# Patient Record
Sex: Male | Born: 1940 | Race: White | Hispanic: No | Marital: Married
Health system: Midwestern US, Community
[De-identification: ages and names within clinical notes are randomized; demographics above are authoritative.]

## PROBLEM LIST (undated history)

## (undated) DIAGNOSIS — R1310 Dysphagia, unspecified: Secondary | ICD-10-CM

## (undated) DIAGNOSIS — E1122 Type 2 diabetes mellitus with diabetic chronic kidney disease: Secondary | ICD-10-CM

## (undated) DIAGNOSIS — F03A Unspecified dementia, mild, without behavioral disturbance, psychotic disturbance, mood disturbance, and anxiety (HCC): Principal | ICD-10-CM

## (undated) DIAGNOSIS — G3183 Dementia with Lewy bodies: Secondary | ICD-10-CM

## (undated) DIAGNOSIS — N183 Chronic kidney disease, stage 3 unspecified (HCC): Secondary | ICD-10-CM

## (undated) DIAGNOSIS — J069 Acute upper respiratory infection, unspecified: Secondary | ICD-10-CM

## (undated) DIAGNOSIS — F02B Dementia in other diseases classified elsewhere, moderate, without behavioral disturbance, psychotic disturbance, mood disturbance, and anxiety (HCC): Secondary | ICD-10-CM

## (undated) DIAGNOSIS — IMO0001 Reserved for inherently not codable concepts without codable children: Secondary | ICD-10-CM

## (undated) DIAGNOSIS — R221 Localized swelling, mass and lump, neck: Secondary | ICD-10-CM

## (undated) DIAGNOSIS — C801 Malignant (primary) neoplasm, unspecified: Secondary | ICD-10-CM

## (undated) DIAGNOSIS — R059 Cough, unspecified: Secondary | ICD-10-CM

## (undated) DIAGNOSIS — C760 Malignant neoplasm of head, face and neck: Secondary | ICD-10-CM

## (undated) DIAGNOSIS — E119 Type 2 diabetes mellitus without complications: Secondary | ICD-10-CM

## (undated) MED ORDER — HYDROCODONE-ACETAMINOPHEN 5 MG-325 MG TAB
5-325 mg | ORAL_TABLET | Freq: Four times a day (QID) | ORAL | Status: AC | PRN
Start: ? — End: 2012-09-04

---

## 2007-10-08 NOTE — Op Note (Signed)
Op Notes signed by  at 10/08/07 [id number]                  Author: Jimmey RalphGelrud, Mirren Gest G  Service: --  Author Type: Physician            Filed:   Date of Service: 10/08/07 0030  Status: Signed          <!--EPICS--> Name:      Stanley CooleySNEAD, Karder H<BR> MR #:      [id number]                    Surgeon:        Rodman KeyLouis G. Shantana Christon, M.D.<BR> Account #: [id number]                  Surgery Date:<BR> DOB:       09-29-1940<BR> Age:       [age]                           Location:<BR> <BR>                              OPERATIVE REPORT<BR> <BR> <BR> <BR> PROCEDURE:  Colonoscopy.<BR> <BR> INDICATION:  The patient who has  had some hemorrhoidal bleeding, as well as<BR> diarrhea.  The diarrhea was ceased on the metformin, the dose was<BR> diminished.  The rectal bleeding has not recurred since the diarrhea has<BR> resolved.  This procedure is undertaken to screen the colon  further at this<BR> time.<BR> <BR> ANESTHESIA:<BR> 1.     Fentanyl 100 mcg intravenously.<BR> 2.     Versed 4 mg intravenously.<BR> <BR> ENDOSCOPE:  PCF-H180AL.<BR> <BR> OPERATOR:  Gwynn Chalker, MD.<BR> <BR> ESTIMATED BLOOD LOSS:<BR> <BR> SPECIMENS:<BR>  <BR> PROCEDURE IN DETAIL:  The patient was placed in left lateral decubitus<BR> position.  The endoscope advanced with the lumen always in view, to the<BR> cecum.  No abnormalities of the base of the cecum inclusive of the area<BR> behind the ileocecal  valve was seen. The ascending colon, hepatic flexure,<BR> transverse colons were without abnormalities.<BR> <BR> The splenic flexure had a single medium-sized diverticula and 2 other<BR> medium-sized diverticula were seen in the distal descending colon.   No<BR> other abnormalities were seen in the sigmoid colon nor rectum.<BR> Retroflexion of the colonoscope within the rectum revealed only some small<BR> hemorrhoids not actively bleeding at this time.<BR> <BR> The endoscope was then straightened and  removed, and the patient observed<BR> to tolerate the procedure well.  He was  returned to the recovery room in<BR> good condition.<BR> <BR> IMPRESSION:<BR> 1.     Diverticulosis coli, mild.<BR> 2.     Hemorrhoidal disease, mild.<BR> <BR> PLAN:<BR> 1.      Continue on his current medical regimen.<BR> 2.     He will return for follow up if diarrhea recurs.<BR> 3.     Elective colonoscopy in 10 years' time.<BR> <BR> <BR> <BR> <BR> <BR> <BR> E-Signed By<BR> Faithlyn Recktenwald G. Jamison NeighborGelrud, M.D. 10/08/2007 06:53<BR> Kataleyah Carducci  G. Ashrith Sagan, M.D.<BR> <BR> cc:   Cainan Trull G. Shakeda Pearse, M.D.<BR>       Shepard GeneralKelly E White, MD<BR> <BR> <BR> <BR> LGG/FI3; D: 10/07/2007  3:55 P; T: 10/08/2007 12:30 A; Job# [id number]<BR> <!--EPICE-->

## 2007-10-08 NOTE — Op Note (Signed)
Name: ROLLEN, SELDERS  MR #: [id number] Surgeon: Rodman Key. Jamison Neighbor, M.D.  Account #: [id number] Surgery Date:  DOB: Jul 10, 1941  Age: 67 Location:     OPERATIVE REPORT        PROCEDURE: Colonoscopy.    INDICATION: The patient who has had some hemorrhoidal bleeding, as well as  diarrhea. The diarrhea was ceased on the metformin, the dose was  diminished. The rectal bleeding has not recurred since the diarrhea has  resolved. This procedure is undertaken to screen the colon further at this  time.    ANESTHESIA:  1. Fentanyl 100 mcg intravenously.  2. Versed 4 mg intravenously.    ENDOSCOPE: PCF-H180AL.    OPERATOR: Brantley Fling, MD.    ESTIMATED BLOOD LOSS:    SPECIMENS:    PROCEDURE IN DETAIL: The patient was placed in left lateral decubitus  position. The endoscope advanced with the lumen always in view, to the  cecum. No abnormalities of the base of the cecum inclusive of the area  behind the ileocecal valve was seen. The ascending colon, hepatic flexure,  transverse colons were without abnormalities.    The splenic flexure had a single medium-sized diverticula and 2 other  medium-sized diverticula were seen in the distal descending colon. No  other abnormalities were seen in the sigmoid colon nor rectum.  Retroflexion of the colonoscope within the rectum revealed only some small  hemorrhoids not actively bleeding at this time.    The endoscope was then straightened and removed, and the patient observed  to tolerate the procedure well. He was returned to the recovery room in  good condition.    IMPRESSION:  1. Diverticulosis coli, mild.  2. Hemorrhoidal disease, mild.    PLAN:  1. Continue on his current medical regimen.  2. He will return for follow up if diarrhea recurs.  3. Elective colonoscopy in 10 years' time.              E-Signed By  Rodman Key Jamison Neighbor, M.D. 10/08/2007 06:53  Terianne Thaker G. Amrita Radu, M.D.    cc: Rodman Key. Jamison Neighbor, M.D.   Shepard General, MD         LGG/FI3; D: 10/07/2007 3:55 P; T: 10/08/2007 12:30 A; Job# [id number]

## 2008-03-18 LAB — METABOLIC PANEL, COMPREHENSIVE
A-G Ratio: 1.3 (ref 1.1–2.2)
ALT (SGPT): 45 U/L (ref 30–65)
AST (SGOT): 21 U/L (ref 15–37)
Albumin: 4.2 g/dL (ref 3.5–5.0)
Alk. phosphatase: 55 U/L (ref 50–136)
Anion gap: 8 mmol/L (ref 5–15)
BUN/Creatinine ratio: 16 (ref 12–20)
BUN: 18 MG/DL (ref 6–20)
Bilirubin, total: 0.5 MG/DL (ref <1.0)
CO2: 29 MMOL/L (ref 21–32)
Calcium: 9.2 MG/DL (ref 8.5–10.1)
Chloride: 104 MMOL/L (ref 97–108)
Creatinine: 1.1 MG/DL (ref 0.6–1.3)
GFR est AA: >60 mL/min/{1.73_m2} (ref >60)
GFR est non-AA: >60 mL/min/{1.73_m2} (ref >60)
Globulin: 3.3 g/dL (ref 2.0–4.0)
Glucose: 151 MG/DL — ABNORMAL HIGH (ref 50–100)
Potassium: 4.5 MMOL/L (ref 3.5–5.1)
Protein, total: 7.5 g/dL (ref 6.4–8.2)
Sodium: 141 MMOL/L (ref 136–145)

## 2008-03-18 LAB — LIPID PANEL
CHOL/HDL Ratio: 4.3 (ref 0–5.0)
Cholesterol, total: 142 MG/DL (ref <200)
HDL Cholesterol: 33 MG/DL — ABNORMAL LOW (ref 40–60)
LDL, calculated: 79.4 MG/DL (ref 0–100)
Triglyceride: 148 MG/DL (ref 30–200)
VLDL, calculated: 29.6 MG/DL

## 2008-03-19 LAB — HEMOGLOBIN A1C WITH EAG: Hemoglobin A1c: 7 % — ABNORMAL HIGH (ref 4.2–5.8)

## 2008-06-23 ENCOUNTER — Ambulatory Visit

## 2008-06-23 LAB — METABOLIC PANEL, BASIC
Anion gap: 10 mmol/L (ref 5–15)
BUN/Creatinine ratio: 22 — ABNORMAL HIGH (ref 12–20)
BUN: 22 MG/DL — ABNORMAL HIGH (ref 6–20)
CO2: 27 MMOL/L (ref 21–32)
Calcium: 9.4 MG/DL (ref 8.5–10.1)
Chloride: 102 MMOL/L (ref 97–108)
Creatinine: 1 MG/DL (ref 0.6–1.3)
GFR est AA: >60 mL/min/{1.73_m2} (ref >60)
GFR est non-AA: >60 mL/min/{1.73_m2} (ref >60)
Glucose: 158 MG/DL — ABNORMAL HIGH (ref 50–100)
Potassium: 4.5 MMOL/L (ref 3.5–5.1)
Sodium: 139 MMOL/L (ref 136–145)

## 2008-06-23 MED ORDER — ZOSTAVAX (PF) 19,400 UNIT/0.65 ML SUBCUTANEOUS SUSPENSION
19400 unit/0.65 mL | SUBCUTANEOUS | Status: DC
Start: 2008-06-23 — End: 2008-09-22

## 2008-06-23 NOTE — Patient Instructions (Signed)
English   Spanish   Back Pain: After Your Visit  Your Care Instructions  Back pain has many possible causes. It is often related to problems with muscles and ligaments of the back. It may also be related to problems with the nerves, discs, or bones of the back. Moving, lifting, standing, sitting, or sleeping in an awkward way can strain the back. Sometimes you do not notice the injury until later. Arthritis is another common cause of back pain.  Although it may hurt a lot, back pain usually improves on its own within several weeks. Most people recover in 12 weeks or less. Using good home treatment and being careful not to stress your back can help you feel better sooner.  Follow-up care is a key part of your treatment and safety. Be sure to make and go to all appointments, and call your doctor if you are having problems. It???s also a good idea to know your test results and keep a list of the medicines you take.  How can you care for yourself at home?  ?? Sit or lie in positions that are most comfortable and reduce your pain. Try one of these positions when you lie down:   Lie on your back with your knees bent and supported by large pillows.   Lie on the floor with your legs on the seat of a sofa or chair.   Lie on your side with your knees and hips bent and a pillow between your legs.   Lie on your stomach if it does not make pain worse.  Do not sit up in bed, and avoid soft couches and twisted positions. Bed rest can help relieve pain at first, but it delays healing. Avoid bed rest after the first day.   Change positions every 30 minutes. If you must sit for long periods of time, take breaks from sitting. Get up and walk around, or lie in a comfortable position.   For the first 2 or 3 days, put ice or cold packs on your back for 10 to 20 minutes at a time, several times a day. (Put a thin cloth between the ice pack and your skin.) This reduces pain.    After the first 2 or 3 days, use a warm pack or heating pad for 20 minutes at a time. Hot showers will also help. Hot baths may help as long as you can lie or sit in a position that does not stress your back. You may also keep using ice if it helps.   Take pain medicines exactly as directed.   If the doctor gave you a prescription medicine for pain, take it as prescribed.   If you are not taking a prescription pain medicine, ask your doctor if you can take an over-the-counter medicine.   Do not take two or more pain medicines at the same time unless the doctor told you to. Many pain medicines have acetaminophen, which is Tylenol. Too much acetaminophen (Tylenol) can be harmful.  Take short walks several times a day. You can start with 5 to 10 minutes, 3 to 4 times a day, and work up to longer walks. Stick to level surfaces and avoid hills and stairs until your back is better.   Return to work and other activities as soon as you can. Continued rest without activity is usually not good for your back.   To prevent future back pain, do exercises to stretch and strengthen your back and stomach. Learn how   to use good posture, safe lifting techniques, and proper body mechanics.  When should you call for help?  Call 911 anytime you think you may need emergency care. For example, call if:  ?? You lose bladder or bowel control.   You suddenly cannot walk or stand.   You have sudden numbness or weakness in both legs.  Call your doctor now or seek immediate medical care if:  ?? You have new pain, numbness, tingling, or weakness, especially in the buttocks, genital or rectal area, legs, or feet.   You have symptoms of a urinary infection. For example:   You have blood or pus in your urine.   You have pain in your back just below your rib cage. This is called flank pain.   You have a fever, chills, or body aches.   It hurts to urinate. You have groin or belly pain.   Watch closely for changes in your health, and be sure to contact your doctor if:  ?? Your back pain gets worse or more frequent.   Your back pain is not better after 1 week of home treatment. It may take a lot longer for the pain to go away completely, but it should feel at least a little better.   Where can you learn more?   Go to http://www.healthwise.net/[facility]  Enter I594 in the search box to learn more about "Back Pain: After Your Visit".    ?? 2006 - 2009 Healthwise[employer] Inc[employer]. Care instructions adapted under license by [facility] (which disclaims liability or warranty for this information) . This care instruction is for use with your licensed healthcare professional. If you have questions about a medical condition or this instruction, always ask your healthcare professional. Healthwise disclaims any warranty or liability for your use of this information.

## 2008-06-23 NOTE — Progress Notes (Signed)
HISTORY OF PRESENT ILLNESS  Stanley Holt is a 67 y.o. male.  HPI  The patient presents today in follow-up for his medical problems of adult onset diabetes mellitus, hyperlipidemia and hypertension.  He states he has been doing well on his current medications.  Blood sugars he states have been good, although he does not bring in his numbers today and cannot remember what they are.  He states he does follow a diabetic diet and has been working on that.  His last hemoglobin A1c was 7.0.  He is getting yearly eye checks.      Today, he also complains of right mid to upper back discomfort that started over the past week.  He does not note any particular activity or injury to his back.  The pain does not radiate down his legs.  There is no associated numbness or tingling or weakness.      He denies any other problems today.         Current outpatient prescriptions   Medication Sig Dispense Refill   ??? zoster vaccine live, pf, (ZOSTAVAX) 19,400 unit SolR 19,400 Units by SubCUTAneous route as directed. x1  1 Dose  0   ??? metformin (GLUCOPHAGE) 500 mg tablet Take 1 Tab by mouth two (2) times daily (with meals).       ??? glimepiride (AMARYL) 4 mg tablet Take 4 mg by mouth every morning.       ??? lisinopril (PRINIVIL, ZESTRIL) 10 mg tablet Take  by mouth daily.       ??? aspirin 81 mg Tab Take  by mouth.       ??? omega-3 fatty acids-vitamin e (FISH OIL) 1,000 mg Cap Take  by mouth.       ??? VITAMIN B COMP W-C (SUPER B COMPLEX + C PO) Take  by mouth.         No Known Allergies  History   Substance Use Topics   ??? Tobacco Use: Quit -- 40 years     Quit date: 09/04/2002   ??? Alcohol Use:         ROS  as per HPI       Physical Exam    OBJECTIVE: BP 112/72   Pulse 85   Resp 16   Wt 242 lb 3.2 oz (109.861 kg)   General appearance: alert, well appearing, and in no distress.  Chest: clear to auscultation, no wheezes, rales or rhonchi, symmetric air entry.   CVS exam: normal rate and regular rhythm, S1 and S2 normal.   Exam of extremities: pedal edema tr +, intact peripheral pulses  Back exam: pain with motion noted during exam, tenderness noted right parathoracic muscle tenderness and spasm, negative straight leg lift.     Neuro nonfocal  ASSESSMENT and PLAN  Adult onset diabetes mellitus.  Check hemoglobin A1c, counseled again regarding importance of strict diabetic diet.  Would like to see his hemoglobin A1c closer to 6.5.  The patient is aware of this.  He does not want to add any new medications but instead work on lifestyle changes over the next several months, even if his hemoglobin A1c is elevated above goal.  He is getting yearly eye checks.  Encouraged foot care.   Hyperlipidemia.  Fasting lipid panel, LFT's.  Continued with low-fat, low-cholesterol diet.    Hypertension, stable on current medication.   Thoracic strain.  Recommend Tylenol or Advil prn.  Follow-up if any acute worsening or no improvement.

## 2008-06-24 LAB — HEMOGLOBIN A1C WITH EAG: Hemoglobin A1c: 6.9 % — ABNORMAL HIGH (ref 4.2–5.8)

## 2008-07-27 MED ORDER — LISINOPRIL 10 MG TAB
10 mg | ORAL_TABLET | Freq: Every day | ORAL | Status: DC
Start: 2008-07-27 — End: 2008-09-22

## 2008-09-22 ENCOUNTER — Ambulatory Visit

## 2008-09-22 MED ORDER — GLIMEPIRIDE 4 MG TAB
4 mg | ORAL_TABLET | Freq: Two times a day (BID) | ORAL | Status: DC
Start: 2008-09-22 — End: 2009-04-02

## 2008-09-22 MED ORDER — LISINOPRIL 10 MG TAB
10 mg | ORAL_TABLET | Freq: Every day | ORAL | Status: DC
Start: 2008-09-22 — End: 2009-04-02

## 2008-09-22 MED ORDER — METFORMIN SR 500 MG 24 HR TABLET
500 mg | ORAL_TABLET | Freq: Every day | ORAL | Status: DC
Start: 2008-09-22 — End: 2009-03-31

## 2008-09-22 NOTE — Progress Notes (Signed)
Here for a 3 month f/u also C/o elevated bs for a short time but has now improved

## 2008-09-23 LAB — METABOLIC PANEL, BASIC
Anion gap: 13 mmol/L (ref 5–15)
BUN/Creatinine ratio: 18 (ref 12–20)
BUN: 18 MG/DL (ref 6–20)
CO2: 27 MMOL/L (ref 21–32)
Calcium: 8.8 MG/DL (ref 8.5–10.1)
Chloride: 102 MMOL/L (ref 97–108)
Creatinine: 1 MG/DL (ref 0.6–1.3)
GFR est AA: >60 mL/min/{1.73_m2} (ref >60)
GFR est non-AA: >60 mL/min/{1.73_m2} (ref >60)
Glucose: 79 MG/DL (ref 50–100)
Potassium: 3.9 MMOL/L (ref 3.5–5.1)
Sodium: 142 MMOL/L (ref 136–145)

## 2008-09-23 LAB — HEMOGLOBIN A1C WITH EAG: Hemoglobin A1c: 7.6 % — ABNORMAL HIGH (ref 4.2–5.8)

## 2008-09-23 NOTE — Progress Notes (Signed)
HISTORY OF PRESENT ILLNESS  Stanley Holt is a 68 y.o. male.  HPIThe patient presents today in follow-up for his medical problems of adult onset diabetes mellitus, hyperlipidemia.  He states he has noted some increase blood sugars over past few weeks associated with recent URI.  Blood sugars in the 160's, so he increased his metformin er to 3 tablets a day.  He feels like his blood sugars have started to improve and he denies any side effects on the increased dose.  His uri symptoms have resolved.  He states he does follow a diabetic diet and has been working on that.  His last hemoglobin A1c was 6.9.  He is getting yearly eye checks.    He denies any other problems today.      ROSas per HPI   Physical Exam  BP 112/84   Pulse 84   Wt 242 lb (109.77 kg)  General appearance: alert, well appearing, and in no distress.  Chest: clear to auscultation, no wheezes, rales or rhonchi, symmetric air entry.   CVS exam: normal rate and regular rhythm,  Exam of extremities: peripheral pulses normal, no pedal edema, no clubbing or cyanosis     ASSESSMENT and PLAN  Christop was seen today for follow-up.    Diagnoses and associated orders for this visit:    Dm w/o complication type ii, uncontrolled  -     Tolerating the increase on metformin er, will recheck hgba1c in 3 months to reassess  - metformin-ER (GLUCOPHAGE XR) tablet; Take 3 Tabs by mouth daily (with dinner).  - glimepiride (AMARYL) tablet; Take 1 Tab by mouth two (2) times a day.  - HEMOGLOBIN A1C; Future  - METABOLIC PANEL, BASIC; Future    Other and unspecified hyperlipidemia stable    Microalbuminuria stable  - lisinopril (PRINIVIL, ZESTRIL) tablet; Take 1 Tab by mouth daily.    Follow-up Disposition:  Return in about 4 months (around 01/20/2009).  lab results and schedule of future lab studies reviewed with patient  reviewed diet, exercise and weight control  cardiovascular risk and specific lipid/LDL goals reviewed   specific diabetic recommendations: diabetic diet discussed in detail, written exchange diet given, home glucose monitoring emphasized, foot care discussed and Podiatry visits discussed, annual eye examinations at Ophthalmology discussed, glycohemoglobin and other lab monitoring discussed and labs immediately prior to next visit  use of aspirin to prevent MI and TIA's discussed

## 2009-02-03 ENCOUNTER — Ambulatory Visit

## 2009-02-03 LAB — LIPID PANEL
CHOL/HDL Ratio: 4.4 (ref 0–5.0)
Cholesterol, total: 150 MG/DL (ref <200)
HDL Cholesterol: 34 MG/DL
LDL, calculated: 83.6 MG/DL (ref 0–100)
Triglyceride: 162 MG/DL — ABNORMAL HIGH (ref <150)
VLDL, calculated: 32.4 MG/DL

## 2009-02-03 LAB — METABOLIC PANEL, BASIC
Anion gap: 10 mmol/L (ref 5–15)
BUN/Creatinine ratio: 24 — ABNORMAL HIGH (ref 12–20)
BUN: 24 MG/DL — ABNORMAL HIGH (ref 6–20)
CO2: 25 MMOL/L (ref 21–32)
Calcium: 9.3 MG/DL (ref 8.5–10.1)
Chloride: 101 MMOL/L (ref 97–108)
Creatinine: 1 mg/dL (ref 0.6–1.3)
GFR est AA: >60 mL/min/{1.73_m2} (ref >60)
GFR est non-AA: >60 mL/min/{1.73_m2} (ref >60)
Glucose: 117 MG/DL — ABNORMAL HIGH (ref 65–100)
Potassium: 4.7 MMOL/L (ref 3.5–5.1)
Sodium: 136 MMOL/L (ref 136–145)

## 2009-02-03 LAB — PSA SCREENING (SCREENING): Prostate Specific Ag: 3.8 ng/mL (ref 0.0–4.0)

## 2009-02-03 LAB — MICROALBUMIN, UR, RAND W/ MICROALB/CREAT RATIO
Creatinine, urine random: 83.5 mg/dL
Microalbumin,urine random: 3.89 MG/DL
Microalbumin/Creat ratio (mg/g creat): 47 mg/g — ABNORMAL HIGH (ref 0–30)

## 2009-02-03 NOTE — Progress Notes (Signed)
DM follow up

## 2009-02-03 NOTE — Progress Notes (Signed)
HISTORY OF PRESENT ILLNESS  Stanley Holt is a 68 y.o. male.  HPI He presents today in follow up AODM, microalbuminuria, osteoarthritis.   Diabetes Mellitus:he does have mild diarrhea on metformin.  He has diabetes mellitus.  Diabetic ROS - medication compliance: compliant all of the time, diabetic diet compliance: compliant most of the time, home glucose monitoring: is performed regularly, fasting values range less than 140, last eye exam approximately 2years ago.     Osteoarthritis and Chronic Pain:  Patient has osteoarthritis, primarily affecting the back and knees.   Symptoms onset: problem is longstanding.  Rheumatological ROS: stable, mild-to-moderate joint symptoms intermittently, reasonably well controlled by PRN meds.   Response to treatment plan: stable.   Current outpatient prescriptions   Medication Sig Dispense Refill   ??? metformin ER (GLUCOPHAGE XR) 500 mg tablet Take 3 Tabs by mouth daily (with dinner).  270 Tab  1   ??? lisinopril (PRINIVIL, ZESTRIL) 10 mg tablet Take 1 Tab by mouth daily.  90 Tab  1   ??? glimepiride (AMARYL) 4 mg tablet Take 1 Tab by mouth two (2) times a day.  180 Tab  1   ??? aspirin 81 mg Tab Take  by mouth.       ??? omega-3 fatty acids-vitamin e (FISH OIL) 1,000 mg Cap Take  by mouth.       ??? VITAMIN B COMP W-C (SUPER B COMPLEX + C PO) Take  by mouth.           No Known Allergies  History   Substance Use Topics   ??? Tobacco Use: Quit -- 40 years     Quit date: 09/04/2002   ??? Alcohol Use:         ROS  as per HPI     Physical Exam  BP 104/76   Pulse 80   Wt 236 lb 12.8 oz (107.412 kg)  General appearance: alert, well appearing, and in no distress.  Chest: clear to auscultation, no wheezes, rales or rhonchi, symmetric air entry.   CVS exam: normal rate and regular rhythm, S1 and S2 normal.  Exam of extremities: peripheral pulses normal, no pedal edema, no clubbing or cyanosis     Assessment/ Plan:    1. 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  He would be agreeable to trying additional metformin if hgba1c not at goal.  He does note some mild diarrhea with his metformin   2. Microalbuminuria (791.0Y)  Recheck urine   3. Screening PSA (prostate specific antigen) (V76.44C)  PSA SCREENING (MEDICARE)   4. Osteoarthritis  Continue with tylenol prn.  I have discussed the diagnosis with the patient and the intended plan as seen in the above orders.  The patient has received an after-visit summary.  I have discussed medication side effects and warnings with the patient as well.    Follow-up Disposition:  Return in about 4 months (around 06/05/2009).

## 2009-02-04 LAB — HEMOGLOBIN A1C WITH EAG: Hemoglobin A1c: 6.6 % — ABNORMAL HIGH (ref 4.2–5.8)

## 2009-02-24 NOTE — Telephone Encounter (Signed)
LMTCB

## 2009-02-26 NOTE — Telephone Encounter (Signed)
Message copied by Raleigh Lions on Fri Feb 26, 2009  1:44 PM  ------       Message from: [place], Nicholaus Bloom, E       Created: Tue Feb 23, 2009  5:52 PM         PSA 3.8 notify patient that this is upper limits of normal and would like to repeat PSA in 6 months.       If he is having any changes in urinary stream or any hesitancy then will refer to urology.       ----- Message -----          From: Mickel Crow, MD          Sent: 02/11/2009   8:21 AM            To: Mickel Crow, MD              psa compare 3.8

## 2009-02-26 NOTE — Telephone Encounter (Signed)
called on preferred # [phone] no answer no machine

## 2009-03-02 NOTE — Telephone Encounter (Signed)
Message copied by Raleigh Lions on Tue Mar 02, 2009  4:58 PM  ------       Message from: Kechi, Nicholaus Bloom, E       Created: Tue Feb 23, 2009  5:52 PM         PSA 3.8 notify patient that this is upper limits of normal and would like to repeat PSA in 6 months.       If he is having any changes in urinary stream or any hesitancy then will refer to urology.       ----- Message -----          From: Mickel Crow, MD          Sent: 02/11/2009   8:21 AM            To: Mickel Crow, MD              psa compare 3.8

## 2009-03-02 NOTE — Telephone Encounter (Signed)
Notified patient of lab PSA.  Denies any difficulty urinating but will let us know if he develops any problems.

## 2009-03-15 ENCOUNTER — Ambulatory Visit

## 2009-03-15 LAB — CBC WITH AUTOMATED DIFF
ABS. BASOPHILS: 0 10*3/uL (ref 0.0–0.1)
ABS. EOSINOPHILS: 0.2 10*3/uL (ref 0.0–0.4)
ABS. LYMPHOCYTES: 2 10*3/uL (ref 0.8–3.5)
ABS. MONOCYTES: 0.6 10*3/uL (ref 0.0–1.0)
ABS. NEUTROPHILS: 3.3 10*3/uL (ref 1.8–8.0)
BASOPHILS: 0 % (ref 0–1)
EOSINOPHILS: 3 % (ref 0–7)
HCT: 40.4 % (ref 36.6–50.3)
HGB: 13.9 g/dL (ref 12.1–17.0)
LYMPHOCYTES: 32 % (ref 12–49)
MCH: 31.1 PG (ref 26.0–34.0)
MCHC: 34.4 g/dL (ref 30.0–36.5)
MCV: 90.4 FL — ABNORMAL HIGH (ref 80.0–90.0)
MONOCYTES: 11 % (ref 5–13)
NEUTROPHILS: 54 % (ref 32–75)
PLATELET: 175 10*3/uL (ref 150–400)
RBC: 4.47 M/uL (ref 4.10–5.70)
RDW: 13 % (ref 11.5–14.5)
WBC: 6.1 10*3/uL (ref 4.1–11.1)

## 2009-03-15 NOTE — Progress Notes (Signed)
Lump at right side of throat - came up overnight - noticed last Tuesday morning.  No c/o pain, no c/o soreness.  Doesn't interfere with swallowing.  Pt states it has not grown or shrunk in size.

## 2009-03-15 NOTE — Progress Notes (Signed)
HISTORY OF PRESENT ILLNESS  Alba Kriesel is a 68 y.o. male.  HPI Comments: Noticed it last week 7/5 or 7/6 a swelling on right side of neck, no soreness or problem swallowing, no fever or chills, no bites noticed anywhere, no ear pain but some itching.  No ST.       Review of Systems   Constitutional: Negative for fever.   HENT: Negative for hearing loss, congestion and sore throat.    Respiratory: Negative for cough.    Musculoskeletal: Negative for myalgias.   Skin: Negative for rash.   Neurological: Negative for headaches.     BP 106/76   Pulse 80   Temp(Src) 97.6 ??F (36.4 ??C) (Oral)   Wt 236 lb 9.6 oz (107.321 kg)    Physical Exam   Constitutional: He is oriented to person, place, and time. He appears well-developed and well-nourished.   HENT:   Head: Normocephalic and atraumatic.   Right Ear: Tympanic membrane, external ear and ear canal normal.   Left Ear: Tympanic membrane, external ear and ear canal normal.   Nose: Mucosal edema present. Right sinus exhibits no maxillary sinus tenderness and no frontal sinus tenderness. Left sinus exhibits no maxillary sinus tenderness and no frontal sinus tenderness.   Mouth/Throat: Uvula is midline and mucous membranes are normal. Posterior oropharyngeal erythema present.        No oral lesions    Neck: Carotid bruit is not present.        Has a movable nontender nodule on right side neck in ant. Cervical chain measures 3 cm x 2.5 cm with another anterior to that about 1 cm diameter.    Cardiovascular: Normal rate, regular rhythm and normal heart sounds.    Pulmonary/Chest: Effort normal and breath sounds normal.   Neurological: He is alert and oriented to person, place, and time.       ASSESSMENT and PLAN  1. Lymphadenopathy - swelling (785.6BJ)  CBC WITH AUTOMATED DIFF       Will use saline rinses tid and moist heat to external area and RTC 2 weeks for recheck (pt going OOT for one week) . Will notify us if changes.

## 2009-03-16 NOTE — Telephone Encounter (Signed)
Told CBC WNL and keep fuv in 2 wks when returns from OOT>

## 2009-03-31 ENCOUNTER — Encounter

## 2009-03-31 NOTE — Telephone Encounter (Signed)
Patient has appt with you Friday, but will be out of this med Thursday.

## 2009-04-01 MED ORDER — METFORMIN SR 500 MG 24 HR TABLET
500 mg | ORAL_TABLET | Freq: Every day | ORAL | Status: DC
Start: 2009-04-01 — End: 2010-05-25

## 2009-04-02 MED ORDER — BLOOD SUGAR DIAGNOSTIC TEST STRIPS
PACK | Status: DC
Start: 2009-04-02 — End: 2010-05-30

## 2009-04-02 MED ORDER — LISINOPRIL 10 MG TAB
10 mg | ORAL_TABLET | Freq: Every day | ORAL | Status: DC
Start: 2009-04-02 — End: 2009-12-02

## 2009-04-02 MED ORDER — GLIMEPIRIDE 4 MG TAB
4 mg | ORAL_TABLET | Freq: Two times a day (BID) | ORAL | Status: DC
Start: 2009-04-02 — End: 2010-05-25

## 2009-04-02 NOTE — Progress Notes (Signed)
Patient in for lump in right neck area

## 2009-04-02 NOTE — Progress Notes (Signed)
[facility] Dr[facility] Karrie Doffing Appt Aug 3rd 9:50 AM

## 2009-04-04 NOTE — Progress Notes (Signed)
HISTORY OF PRESENT ILLNESS  Stanley Holt is a 68 y.o. male.  HPI He presents today in follow up for neck mass noted 1 month ago. Patient does not feel there has been any change in size.  He denies recent URI symptoms. He does have a distant history of tobacco use.  No difficulty swallowing.  He also recently had PSA upper limits of normal with no comparison available.  He denies any problems with urinary hesitancy or decreased urinary stream.  He request refill of diabetes medications today.    Current outpatient prescriptions   Medication Sig Dispense Refill   ??? glucose blood VI test strips (ASCENSIA AUTODISC VI, ONE TOUCH ULTRA TEST VI) strip 1 Each by Does Not Apply route. As directed  1 Package  11   ??? glimepiride (AMARYL) 4 mg tablet Take 1 Tab by mouth two (2) times a day.  180 Tab  1   ??? lisinopril (PRINIVIL, ZESTRIL) 10 mg tablet Take 1 Tab by mouth daily.  90 Tab  1   ??? metformin ER (GLUCOPHAGE XR) 500 mg tablet Take 3 Tabs by mouth daily (with dinner).  270 Tab  1   ??? ERGOCALCIFEROL (VITAMIN D PO) Take  by mouth.       ??? aspirin 81 mg Tab Take  by mouth.       ??? omega-3 fatty acids-vitamin e (FISH OIL) 1,000 mg Cap Take  by mouth.       ??? VITAMIN B COMP W-C (SUPER B COMPLEX + C PO) Take  by mouth.           No Known Allergies  History   Substance Use Topics   ??? Tobacco Use: Quit -- 40 years     Quit date: 09/04/2002   ??? Alcohol Use:         ROS  as per HPI     Physical Exam  BP 104/70   Pulse 68   Wt 237 lb 12.8 oz (107.865 kg)  General appearance: alert, well appearing, and in no distress.  Head; normocephalic, atraumatic. PERLA. ENT- right anterior cervical 3 x 2 cm mass, nontender, firm, no bruit..   Chest: clear to auscultation, no wheezes, rales or rhonchi, symmetric air entry.   CVS exam: normal rate and regular rhythm, S1 and S2 normal.  Exam of extremities: peripheral pulses normal, no pedal edema, no clubbing or cyanosis     Assessment/ Plan:    1. Neck mass (784.16F) uncertain etiology and will refer to ENT for further evaluation and biopsy of area if deemed necessary REFERRAL TO ENT   2. Elevated PSA (790.93A) upper limits of normal and will repeat in 6 months to assure stability, PROSTATE SPECIFIC AG (PSA)   3. 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  The patient has received an after-visit summary along with patient information handout.  I have discussed medication side effects and warnings with the patient as well.    Follow-up Disposition: 3 months

## 2009-04-29 NOTE — Progress Notes (Signed)
Patient in for discussion of lab results

## 2009-04-29 NOTE — Progress Notes (Signed)
HISTORY OF PRESENT ILLNESS  Stanley Holt is a 68 y.o. male.  HPI He presents today in follow up for newly diagnosed squamous cell ca of neck.  He is accompanied by his wife to discuss his therapeutic options.  He has an appt with Dr Crista Curb in am.  Cardiovascular Review:  Diet and Lifestyle: generally follows a low fat low cholesterol diet, generally follows a low sodium diet, nonsmoker.  Pertinent ROS: taking medications as instructed, no medication side effects noted, no TIA's, no chest pain on exertion, no dyspnea on exertion, no swelling of ankles.   Diabetes Mellitus:  He has diabetes mellitus.  Diabetic ROS - medication compliance: compliant all of the time, diabetic diet compliance: compliant most of the time, home glucose monitoring: is performed regularly, fasting values range less that 120 for the most part.   Lab review: labs are reviewed, up to date and normal.   Current outpatient prescriptions   Medication Sig Dispense Refill   ??? glimepiride (AMARYL) 4 mg tablet Take 1 Tab by mouth two (2) times a day.  180 Tab  1   ??? lisinopril (PRINIVIL, ZESTRIL) 10 mg tablet Take 1 Tab by mouth daily.  90 Tab  1   ??? glucose blood VI test strips (ASCENSIA AUTODISC VI, ONE TOUCH ULTRA TEST VI) strip 1 Each by Does Not Apply route. As directed  1 Package  11   ??? metformin ER (GLUCOPHAGE XR) 500 mg tablet Take 3 Tabs by mouth daily (with dinner).  270 Tab  1   ??? ERGOCALCIFEROL (VITAMIN D PO) Take  by mouth.       ??? aspirin 81 mg Tab Take  by mouth.       ??? omega-3 fatty acids-vitamin e (FISH OIL) 1,000 mg Cap Take  by mouth.       ??? VITAMIN B COMP W-C (SUPER B COMPLEX + C PO) Take  by mouth.           No Known Allergies  History   Substance Use Topics   ??? Tobacco Use: Quit -- 40 years     Quit date: 09/04/2002   ??? Alcohol Use:         ROS  as per HPI     Physical Exam  BP 100/72   Pulse 72   Wt 242 lb (109.77 kg)  General appearance: alert, well appearing, and in no distress.   Head; normocephalic, atraumatic. PERLA. ENT- throat normal without erythema or exudate and right neck mass unchanged.   Chest: clear to auscultation, no wheezes, rales or rhonchi, symmetric air entry.   CVS exam: normal rate and regular rhythm, S1 and S2 normal.  Exam of extremities: peripheral pulses normal, no pedal edema, no clubbing or cyanosis     Assessment/ Plan:   1. Cancer of neck (195.0AF) long discussion with patient regarding surgery, radiation, chemotherapy.  He has a lot of concerns regarding his overall health and ability to have this treatment.  I have reassured him that he is in good health for this treatment.  The decision on surgery vs chemotherapy along with radiation will need to be discussed further with Dr Crista Curb in oncology and he will be seeing him in the morning.   2. 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     I  have discussed the diagnosis with the patient and the intended plan as seen in the above orders.  The patient has received an after-visit summary.  I have discussed medication side effects and warnings with the patient as well.    Follow-up Disposition:  Return if symptoms worsen or fail to improve.

## 2009-04-30 ENCOUNTER — Encounter

## 2009-05-04 LAB — METABOLIC PANEL, BASIC
Anion gap: 7 mmol/L (ref 5–15)
BUN/Creatinine ratio: 24 — ABNORMAL HIGH (ref 12–20)
BUN: 26 MG/DL — ABNORMAL HIGH (ref 6–20)
CO2: 29 MMOL/L (ref 21–32)
Calcium: 9.3 MG/DL (ref 8.5–10.1)
Chloride: 106 MMOL/L (ref 97–108)
Creatinine: 1.1 MG/DL (ref 0.6–1.3)
GFR est AA: >60 mL/min/{1.73_m2} (ref >60)
GFR est non-AA: >60 mL/min/{1.73_m2} (ref >60)
Glucose: 124 MG/DL — ABNORMAL HIGH (ref 65–100)
Potassium: 4.6 MMOL/L (ref 3.5–5.1)
Sodium: 142 MMOL/L (ref 136–145)

## 2009-05-04 LAB — CBC WITH AUTOMATED DIFF
ABS. BASOPHILS: 0 10*3/uL (ref 0.0–0.1)
ABS. EOSINOPHILS: 0.2 10*3/uL (ref 0.0–0.4)
ABS. LYMPHOCYTES: 1.6 10*3/uL (ref 0.8–3.5)
ABS. MONOCYTES: 0.7 10*3/uL (ref 0.0–1.0)
ABS. NEUTROPHILS: 3.1 10*3/uL (ref 1.8–8.0)
BASOPHILS: 0 % (ref 0–1)
EOSINOPHILS: 3 % (ref 0–7)
HCT: 38.9 % (ref 36.6–50.3)
HGB: 13.1 g/dL (ref 12.1–17.0)
LYMPHOCYTES: 28 % (ref 12–49)
MCH: 31 PG (ref 26.0–34.0)
MCHC: 33.7 g/dL (ref 30.0–36.5)
MCV: 92.2 FL — ABNORMAL HIGH (ref 80.0–90.0)
MONOCYTES: 12 % (ref 5–13)
NEUTROPHILS: 57 % (ref 32–75)
PLATELET: 159 10*3/uL (ref 150–400)
RBC: 4.22 M/uL (ref 4.10–5.70)
RDW: 13.1 % (ref 11.5–14.5)
WBC: 5.6 10*3/uL (ref 4.1–11.1)

## 2009-05-05 LAB — GLUCOSE, POC: Glucose (POC): 85 mg/dL (ref 75–110)

## 2009-05-06 LAB — PROTHROMBIN TIME + INR
INR: 1 (ref 0.9–1.1)
Prothrombin time: 10.5 SECS (ref 9.0–11.0)

## 2009-05-06 LAB — PTT: aPTT: 27.2 s (ref 24.0–33.0)

## 2009-05-06 MED ORDER — HEPARIN LOCK FLUSH 100 UNIT/ML IV SOLN
100 unit/mL | INTRAVENOUS | Status: DC | PRN
Start: 2009-05-06 — End: 2009-05-06

## 2009-05-06 MED ADMIN — midazolam (VERSED) injection 0.5-1 mg: INTRAVENOUS | @ 15:00:00 | NDC 63323041112

## 2009-05-06 MED ADMIN — midazolam (VERSED) injection 0.5-1 mg: INTRAVENOUS | @ 14:00:00 | NDC 63323041112

## 2009-05-06 MED ADMIN — fentanyl citrate (pf) injection 12.5-50 mcg: INTRAVENOUS | @ 14:00:00 | NDC 68258301001

## 2009-05-06 MED ADMIN — sodium bicarbonate (NEUT) injection 40 mg: SUBCUTANEOUS | @ 15:00:00 | NDC 00409660902

## 2009-05-06 MED ADMIN — lidocaine (PF) (XYLOCAINE) 10 mg/mL (1 %) injection Soln 10 mL: SUBCUTANEOUS | @ 15:00:00 | NDC 00409427902

## 2009-05-06 MED ADMIN — fentanyl citrate (pf) injection 12.5-50 mcg: INTRAVENOUS | @ 15:00:00 | NDC 68258301001

## 2009-05-06 MED ADMIN — cefazolin (ANCEF) 1 g in 0.9% sodium chloride (MBP/ADV) 50 mL MBP: INTRAVENOUS | @ 14:00:00 | NDC 60505074905

## 2009-05-06 MED ADMIN — heparin (porcine) 100 unit/mL injection 300 Units: @ 15:00:00 | NDC 00641243645

## 2009-05-06 MED ADMIN — lidocaine-epinephrine (XYLOCAINE) 1 %-1:100,000 injection 15 mg: INTRADERMAL | @ 15:00:00 | NDC 00409317801

## 2009-05-06 MED FILL — NEUT 4 % INTRAVENOUS SOLUTION: 4 % | INTRAVENOUS | Qty: 1

## 2009-05-06 MED FILL — MIDAZOLAM 1 MG/ML IJ SOLN: 1 mg/mL | INTRAMUSCULAR | Qty: 6

## 2009-05-06 MED FILL — SODIUM CHLORIDE 0.9 % IV: INTRAVENOUS | Qty: 1000

## 2009-05-06 MED FILL — CEFAZOLIN 1 GRAM SOLUTION FOR INJECTION: 1 gram | INTRAMUSCULAR | Qty: 1000

## 2009-05-06 MED FILL — LIDOCAINE (PF) 10 MG/ML (1 %) IJ SOLN: 10 mg/mL (1 %) | INTRAMUSCULAR | Qty: 30

## 2009-05-06 MED FILL — LIDOCAINE-EPINEPHRINE 1 %-1:100,000 IJ SOLN: 1 %-:00,000 | INTRAMUSCULAR | Qty: 20

## 2009-05-06 MED FILL — FENTANYL CITRATE (PF) 50 MCG/ML IJ SOLN: 50 mcg/mL | INTRAMUSCULAR | Qty: 2

## 2009-05-06 NOTE — Procedures (Signed)
Interventional Radiology    05/06/2009      INFORMED CONSENT OBTAINED    Indication:  Venous access       Procedure(s): portacath placement, GROSHONG TYPE PLACED.        Specimens removed:  none    Complications: None    Primary Physician: Benjamine Sprague. Magnus Ivan, MD    Recomendations: none    Discharge Disposition: dc    Full dictated report to follow    Signed By: Benjamine Sprague. Magnus Ivan, MD

## 2009-05-06 NOTE — Op Note (Signed)
Op Notes filed by Haskell Riling, MD at 06/14/09 1035                Author: Haskell Riling, MD  Service: --  Author Type: Physician       Filed: 06/14/09 1035  Date of Service: 05/06/09 2207  Status: Addendum          Editor: Haskell Riling, MD (Physician)          <!--EPICS--> Name:      Stanley Holt, Stanley Holt<BR> MR #:      [id number]                    Surgeon:        Mcarthur Rossetti Rosemary Mossbarger,<BR>  MD<BR> Account #: [id number]                  Surgery Date:   05/05/2009<BR> DOB:       28-Feb-1941<BR> Age:       68                           Location:<BR> <BR>                              OPERATIVE REPORT<BR> <BR> <BR> PREOPERATIVE DIAGNOSIS:  Right neck squamous cell carcinoma  with unknown<BR> primary.<BR> <BR> POSTOPERATIVE DIAGNOSIS:  Right neck squamous cell carcinoma with unknown<BR> primary.<BR> <BR> OPERATIVE PROCEDURE<BR> 1. Direct laryngoscopy with directed biopsies.<BR> 2. Rigid esophagoscopy.<BR> 3. Nasal endoscopy  with biopsy of nasopharynx.<BR> <BR> SURGEON:  Haskell Riling, MD<BR> <BR> ANESTHESIA:  General endotracheal.<BR> <BR> FINDINGS:  The patient's laryngoscopic exam was relatively normal. There<BR> was only a very small area of nodularity in his  right base of tongue that<BR> was biopsied. His nasopharynx was clear as well.<BR> <BR> COMPLICATIONS:  None.<BR> <BR> INDICATIONS FOR OPERATION:  This patient is a 68 year old gentleman with a<BR> history of a right-sided squamous cell carcinoma. There  is unknown primary<BR> and PET scan showed some slight enhancement at the right base of tongue.<BR> The patient will begin chemotherapy and radiation therapy in the near<BR> future and diagnosis of the primary was attempted intraoperatively today.<BR>  Tonsillectomy was deferred due to the patient starting chemotherapy in the<BR> near future.<BR> <BR> PROCEDURE:  Patient was brought back to the operating room, placed in<BR> supine position on the operating table. After  induction of anesthesia and<BR>  endotracheal intubation the table was turned 90 degrees. A moist Ray-Tec<BR> was placed over his upper gums to protect his gingiva from laryngoscopy.<BR> The Dido laryngoscope was inserted in the patient's oral cavity.<BR> Visualizations of the tonsil  area and palpation revealed to firmness.<BR> Visualization of the base of tongue, vallecula, piriform sinuses, post<BR> cricoid space and larynx were all normal. Palpation of the right base of<BR> tongue revealed some slight firmness there that was biopsied  upcutting<BR> biopsy forceps with 2 specimens. Hemostasis was achieved with<BR> adrenaline-soaked pledgets. Next, rigid esophagoscopy was performed with a<BR> rigid esophagoscope. This was placed in the postcricoid space and advanced<BR> down as far as  it could go into the esophageal lumen. Gradual withdrawal<BR> through the esophagus revealed no lesions. Next, after Afrin-soaked<BR> pledgets were placed in the nasal cavity, nasal endoscopy was performed.<BR> Cup forceps were used to perform biopsy  of the nasopharynx. The specimens<BR> were sent to pathology for permanent pathologic analysis. Bimanual<BR> palpation of the patient's oral cavity and oropharynx revealed no large<BR>  nodules in the tonsils or anywhere else indicating obvious source  of tumor.<BR> The patient was then awaken, extubated, and sent to recovery room in stable<BR> condition, tolerating the procedure well.<BR> <BR> ESTIMATED BLOOD LOSS:  Minimal.<BR> <BR> SPECIMEN:  Right base of tongue biopsy and nasopharynx biopsy.<BR>  <BR> <BR> <BR> Reviewed on 05/07/2009 11:26 AM<BR> <BR> <BR> E-Signed By<BR> Haskell Riling, MD 06/14/2009 10:35<BR> Haskell Riling, MD<BR> <BR> cc:   Haskell Riling, MD<BR> <BR> <BR> <BR> DJV/WMX; D: 05/06/2009 10:50 A; T: 05/06/2009  10:07 P; Doc# [id number]; Job#<BR> [id number]<BJ> <!--EPICE-->

## 2009-05-06 NOTE — Op Note (Addendum)
Name: Stanley Holt, Stanley Holt  MR #: [id number] Surgeon: Haskell Riling,   MD  Account #: [id number] Surgery Date: 05/05/2009  DOB: 26-Sep-1940  Age: 68 Location:     OPERATIVE REPORT      PREOPERATIVE DIAGNOSIS: Right neck squamous cell carcinoma with unknown  primary.    POSTOPERATIVE DIAGNOSIS: Right neck squamous cell carcinoma with unknown  primary.    OPERATIVE PROCEDURE  1. Direct laryngoscopy with directed biopsies.  2. Rigid esophagoscopy.  3. Nasal endoscopy with biopsy of nasopharynx.    SURGEON: Haskell Riling, MD    ANESTHESIA: General endotracheal.    FINDINGS: The patient's laryngoscopic exam was relatively normal. There  was only a very small area of nodularity in his right base of tongue that  was biopsied. His nasopharynx was clear as well.    COMPLICATIONS: None.    INDICATIONS FOR OPERATION: This patient is a 68 year old gentleman with a  history of a right-sided squamous cell carcinoma. There is unknown primary  and PET scan showed some slight enhancement at the right base of tongue.  The patient will begin chemotherapy and radiation therapy in the near  future and diagnosis of the primary was attempted intraoperatively today.  Tonsillectomy was deferred due to the patient starting chemotherapy in the  near future.    PROCEDURE: Patient was brought back to the operating room, placed in  supine position on the operating table. After induction of anesthesia and  endotracheal intubation the table was turned 90 degrees. A moist Ray-Tec  was placed over his upper gums to protect his gingiva from laryngoscopy.  The Dido laryngoscope was inserted in the patient's oral cavity.  Visualizations of the tonsil area and palpation revealed to firmness.  Visualization of the base of tongue, vallecula, piriform sinuses, post  cricoid space and larynx were all normal. Palpation of the right base of  tongue revealed some slight firmness there that was biopsied upcutting   biopsy forceps with 2 specimens. Hemostasis was achieved with  adrenaline-soaked pledgets. Next, rigid esophagoscopy was performed with a  rigid esophagoscope. This was placed in the postcricoid space and advanced  down as far as it could go into the esophageal lumen. Gradual withdrawal  through the esophagus revealed no lesions. Next, after Afrin-soaked  pledgets were placed in the nasal cavity, nasal endoscopy was performed.  Cup forceps were used to perform biopsy of the nasopharynx. The specimens  were sent to pathology for permanent pathologic analysis. Bimanual  palpation of the patient's oral cavity and oropharynx revealed no large  nodules in the tonsils or anywhere else indicating obvious source of tumor.  The patient was then awaken, extubated, and sent to recovery room in stable  condition, tolerating the procedure well.    ESTIMATED BLOOD LOSS: Minimal.    SPECIMEN: Right base of tongue biopsy and nasopharynx biopsy.        Reviewed on 05/07/2009 11:26 AM      E-Signed By  Haskell Riling, MD 06/14/2009 10:35  Haskell Riling, MD    cc: Haskell Riling, MD        DJV/WMX; D: 05/06/2009 10:50 A; T: 05/06/2009 10:07 P; Doc# [id number]; Job#  [id number]

## 2009-05-06 NOTE — Progress Notes (Signed)
Patient instructed by RN.  Understands instructions.  Patient discharged with written instructions, all his belongings and RN.  Discharged by wheelchair.  Wife here to drive him home.  COD:  Stable.

## 2009-05-06 NOTE — Procedures (Signed)
Interventional Radiology    05/06/2009      INFORMED CONSENT OBTAINED    Indication:  Venous access       Procedure(s): portacath placement, GROSHONG TYPE PLACED.        Specimens removed:  none    Complications: None    Primary Physician: Alisia Vanengen Q. Kimika Streater, MD    Recomendations: none    Discharge Disposition: dc    Full dictated report to follow    Signed By: Wacey Zieger Q. Dotti Busey, MD

## 2009-05-06 NOTE — H&P (Signed)
Radiology History and Physical    Patient: Stanley Holt 68 y.o. male       Chief Complaint: No chief complaint on file.      History of Present Illness: HEAD AND NECK CA NEEDS PORT    History:    Past Medical History   Diagnosis Date   ??? Microalbuminuria    ??? Diabetes    ??? Unspecified essential hypertension 06/23/2008   ??? Osteoarthritis 02/03/2009   ??? Microalbuminuria        Family History   Problem Relation Age of Onset   ??? Cancer Mother      breast   ??? Diabetes Brother    ??? Arthritis-rheumatoid Father    ??? Arthritis-rheumatoid Sister        History   Social History   ??? Marital Status: Married     Spouse Name: N/A     Number of Children: N/A   ??? Years of Education: N/A   Occupational History   ??? Not on file.   Social History Main Topics   ??? Tobacco Use: Quit -- 40 years     Quit date: 09/04/2002   ??? Alcohol Use:    ??? Drug Use:    ??? Sexually Active:    Other Topics Concern   ??? Not on file   Social History Narrative   ??? No narrative on file         Allergies: No Known Allergies    Current Medications:  Current outpatient prescriptions   Medication Sig   ??? glimepiride (AMARYL) 4 mg tablet Take 1 Tab by mouth two (2) times a day.   ??? lisinopril (PRINIVIL, ZESTRIL) 10 mg tablet Take 1 Tab by mouth daily.   ??? metformin ER (GLUCOPHAGE XR) 500 mg tablet Take 3 Tabs by mouth daily (with dinner).   ??? ERGOCALCIFEROL (VITAMIN D PO) Take  by mouth.   ??? VITAMIN B COMP W-C (SUPER B COMPLEX + C PO) Take  by mouth.   ??? glucose blood VI test strips (ASCENSIA AUTODISC VI, ONE TOUCH ULTRA TEST VI) strip 1 Each by Does Not Apply route. As directed   ??? aspirin 81 mg Tab Take  by mouth.   ??? omega-3 fatty acids-vitamin e (FISH OIL) 1,000 mg Cap Take  by mouth.   Current facility-administered medications   Medication Dose Route Frequency   ??? cefazolin (ANCEF) 1 g in 0.9% sodium chloride (MBP/ADV) 50 mL MBP  1 g IntraVENous ONCE   ??? lidocaine (PF) (XYLOCAINE) 10 mg/mL (1 %) injection Soln 10 mL  10 mL SubCUTAneous ONCE    ??? sodium bicarbonate (NEUT) injection 40 mg  1 mL SubCUTAneous ONCE   ??? lidocaine-epinephrine (XYLOCAINE) 1 %-1:100,000 injection 15 mg  1.5 mL IntraDERMal ONCE   ??? DISCONTD: heparin (porcine) 100 unit/mL injection 300 Units  300 Units InterCATHeter PRN   ??? DISCONTD: fentanyl citrate (pf) injection 12.5-50 mcg  12.5-50 mcg IntraVENous Multiple   ??? DISCONTD: midazolam (VERSED) injection 0.5-1 mg  0.5-1 mg IntraVENous Multiple   ??? DISCONTD: 0.9% sodium chloride infusion    IntraVENous CONTINUOUS   ??? DISCONTD: heparin (porcine) 100 unit/mL injection 300 Units  300 Units InterCATHeter PRN          Physical Exam:  Blood pressure 132/83, pulse 79, temperature 97.7 ??F (36.5 ??C), resp. rate 16, height 6\' 3"  (1.905 m), weight 240 lb (108.863 kg), SpO2 97.00%.  GENERAL: alert, cooperative, no distress, appears stated age   HEART: regular  rate and rhythm  ABDOMEN: soft, non-tender.      Alerts:  Hospital Problems Date Reviewed: 04/29/2009    None            Laboratory:    Recent Labs   Basename 05/06/09 0915 05/04/09 1134   ??? HGB -- 13.1   ??? HCT -- 38.9   ??? WBC -- 5.6   ??? PLT -- 159   ??? INR 1.0 --   ??? BUN -- 26*   ??? CREA -- 1.1   ??? K -- 4.6   ??? CRCLT -- --           Plan of Care/Planned Procedure:  Risks, benefits, and alternatives reviewed with patient and he agrees to proceed with the procedure.       Benjamine Sprague Magnus Ivan, MD

## 2009-07-02 NOTE — Telephone Encounter (Signed)
T/c with patient informed of elevated PSA level received from [facility].  He would like the info for [facility] mailed to him and he will call to schedule an appt as right now he is in the middle of Chemo every day.

## 2009-10-26 ENCOUNTER — Encounter

## 2009-11-16 ENCOUNTER — Encounter

## 2009-11-16 MED ADMIN — sodium chloride (NS) flush 10 mL: INTRAVENOUS | @ 14:00:00 | NDC 82903065462

## 2009-11-24 ENCOUNTER — Encounter

## 2009-12-02 MED ADMIN — lidocaine-epinephrine (XYLOCAINE) 1 %-1:100,000 injection 15 mg: INTRADERMAL | @ 16:00:00 | NDC 00409317801

## 2009-12-02 MED ADMIN — fentanyl citrate (pf) injection 100 mcg: INTRAVENOUS | @ 16:00:00 | NDC 68258301001

## 2009-12-02 MED ADMIN — 0.9% sodium chloride infusion: INTRAVENOUS | @ 15:00:00 | NDC 00409798303

## 2009-12-02 MED ADMIN — midazolam (VERSED) injection 5 mg: INTRAVENOUS | @ 16:00:00 | NDC 10019002837

## 2009-12-02 MED ADMIN — sodium bicarbonate (NEUT) injection 40 mg: SUBCUTANEOUS | @ 16:00:00 | NDC 00409660902

## 2009-12-02 MED ADMIN — lidocaine (XYLOCAINE) injection 400 mg: SUBCUTANEOUS | @ 16:00:00 | NDC 00409427701

## 2009-12-02 MED FILL — NEUT 4 % INTRAVENOUS SOLUTION: 4 % | INTRAVENOUS | Qty: 1

## 2009-12-02 MED FILL — SODIUM CHLORIDE 0.9 % IV: INTRAVENOUS | Qty: 1000

## 2009-12-02 MED FILL — LIDOCAINE HCL 2 % (20 MG/ML) IJ SOLN: 20 mg/mL (2 %) | INTRAMUSCULAR | Qty: 20

## 2009-12-02 MED FILL — LIDOCAINE-EPINEPHRINE 1 %-1:100,000 IJ SOLN: 1 %-:00,000 | INTRAMUSCULAR | Qty: 20

## 2009-12-02 MED FILL — MIDAZOLAM 1 MG/ML IJ SOLN: 1 mg/mL | INTRAMUSCULAR | Qty: 5

## 2009-12-02 MED FILL — FENTANYL CITRATE (PF) 50 MCG/ML IJ SOLN: 50 mcg/mL | INTRAMUSCULAR | Qty: 4

## 2009-12-02 NOTE — Progress Notes (Signed)
Pt. D/c'ed to home and transported to d/c lot via w/c. Verbalized understanding of d/c instructions

## 2010-05-25 NOTE — Progress Notes (Signed)
HISTORY OF PRESENT ILLNESS  Stanley Holt is a 69 y.o. male.  HPI He presents today in follow up AODM, microalbuminuria, osteoarthritis. He has been through chemo and radiation treatment for squamous cell ca of the tongue and is doing well with no evidence of recurrence on his most recent scans. He is followed by Dr Crista Curb and Trudie Reed.  Unfortunately he has recently been diagnosed by urology with prostate ca and is currently discussing treatment options with his urologist.    Diabetes Mellitus:  He has diabetes mellitus.  Diabetic ROS - medication compliance: compliant all of the time, diabetic diet compliance: compliant most of the time, he states he is seeing an endocrinologist now.    Osteoarthritis and Chronic Pain:  Patient has osteoarthritis, primarily affecting the back and knees.   Symptoms onset: problem is longstanding.  Rheumatological ROS: stable, mild-to-moderate joint symptoms intermittently, reasonably well controlled by PRN meds.   Response to treatment plan: stable.   Current outpatient prescriptions   Medication Sig Dispense Refill   ??? metformin ER (GLUCOPHAGE XR) 500 mg tablet Take 500 mg by mouth two (2) times a day.       ??? ascorbic acid (VITAMIN C) 1,000 mg tablet Take  by mouth.       ??? Saliva Stimulant Agents Comb.3 (BIOTENE MOISTURIZING MOUTH) Spry 1 Spray by Mucous Membrane route once as needed.       ??? glucose blood VI test strips (ASCENSIA AUTODISC VI, ONE TOUCH ULTRA TEST VI) strip 1 Each by Does Not Apply route. As directed  1 Package  11       Allergies   Allergen Reactions   ??? Chlorhexidine Contact Dermatitis     History   Substance Use Topics   ??? Smoking status: Former Smoker -- 40 years     Quit date: 09/04/2002   ??? Smokeless tobacco: Not on file   ??? Alcohol Use:         ROS  as per HPI     Physical Exam  BP 106/72   Pulse 68   Resp 16   Ht 6\' 2"  (1.88 m)   Wt 212 lb (96.163 kg)   BMI 27.22 kg/m2  General appearance: alert, well appearing, and in no distress.   Chest: clear to auscultation, no wheezes, rales or rhonchi, symmetric air entry.   CVS exam: normal rate and regular rhythm, S1 and S2 normal.  Abdominal exam: soft, nontender, nondistended, no masses or organomegaly.   Exam of extremities: peripheral pulses normal, no pedal edema, no clubbing or cyanosis     Encounter Diagnoses   Code Name Primary?   ??? 250.02 DM w/o complication type II, uncontrolled Continue current medication.   Please work on diabetic diet, weight loss, and exercise.  Yes   ??? 141.9D Squamous cell carcinoma of tongue in remission    ??? 791.0Y Microalbuminuria   He had been on acei and for uncertain reasons it has been discontinued and will need to review note from endocrine    ??? 185U Prostate ca  Await recommendations by urologist        Orders Placed This Encounter   ??? metformin ER (GLUCOPHAGE XR) 500 mg tablet   ??? ascorbic acid (VITAMIN C) 1,000 mg tablet       Follow-up Disposition:  Return in about 6 months (around 11/23/2010).  lab results and schedule of future lab studies reviewed with patient  reviewed diet, exercise and weight control  reviewed medications and side effects  in detail

## 2010-05-30 MED ORDER — BLOOD SUGAR DIAGNOSTIC TEST STRIPS
PACK | Status: DC
Start: 2010-05-30 — End: 2010-06-27

## 2010-05-30 NOTE — Telephone Encounter (Signed)
Pt needs a refill on his one touch test strips.  Please put diagnosis code and frequency on rx.

## 2010-06-27 MED ORDER — BLOOD SUGAR DIAGNOSTIC TEST STRIPS
PACK | Status: DC
Start: 2010-06-27 — End: 2010-06-28

## 2010-06-28 MED ORDER — BLOOD SUGAR DIAGNOSTIC TEST STRIPS
ORAL_STRIP | Status: AC
Start: 2010-06-28 — End: ?

## 2010-10-11 NOTE — Progress Notes (Signed)
HISTORY OF PRESENT ILLNESS  Stanley Holt is a 70 y.o. male.  HPI He presents today in follow up for AODM.  He states he is seeing endocrine and his metformin was just increased as he has been feeling better and has gained some wt.  He is doing some walking but does plan to start exercising more.   He has finished treatment for base of cancer at base of his tongue and no recurrence according to pt after chemo and radiation treatment.   He is also being treated for prostate cancer and just received seed injection and tolerated well.    Current Outpatient Prescriptions   Medication Sig Dispense Refill   ??? metformin ER (GLUCOPHAGE XR) 500 mg tablet Take 500 mg by mouth two (2) times a day.       ??? glucose blood VI test strips (ASCENSIA AUTODISC VI, ONE TOUCH ULTRA TEST VI) strip 1 Each by Does Not Apply route. Pt testing once daily.  150 Strip  3   ??? ascorbic acid (VITAMIN C) 1,000 mg tablet Take  by mouth.       ??? Saliva Stimulant Agents Comb.3 (BIOTENE MOISTURIZING MOUTH) Spry 1 Spray by Mucous Membrane route once as needed.         Allergies   Allergen Reactions   ??? Chlorhexidine Contact Dermatitis     History   Substance Use Topics   ??? Smoking status: Former Smoker -- 40 years     Quit date: 09/04/2002   ??? Smokeless tobacco: Not on file   ??? Alcohol Use:       ROS  as per HPI     Physical Exam  BP 99/72   Pulse 82   Ht 6\' 2"  (1.88 m)   Wt 222 lb (100.699 kg)   BMI 28.50 kg/m2  General appearance: alert, well appearing, and in no distress.  Chest: clear to auscultation, no wheezes, rales or rhonchi, symmetric air entry.   CVS exam: normal rate and regular rhythm, S1 and S2 normal.  Exam of extremities: peripheral pulses normal, no pedal edema, no clubbing or cyanosis     ASSESSMENT and PLAN  Encounter Diagnoses   Name Primary?   ??? DM w/o complication type II, uncontrolled   Followed by endocrine and reviewed recent labs at goal Yes   ??? Microalbuminuria    ??? Prostate cancer   Currently seeing urology and just had seeds placed and tolerating well    ??? Head and neck cancer stable with no evidence or recurrence at this time        Follow-up Disposition:  Return in about 1 year (around 10/12/2011).  current treatment plan is effective, no change in therapy  lab results and schedule of future lab studies reviewed with patient  reviewed diet, exercise and weight control  cardiovascular risk and specific lipid/LDL goals reviewed  reviewed medications and side effects in detail  use of aspirin to prevent MI and TIA's discussed .

## 2011-10-19 MED ORDER — ZOSTER VACCINE LIVE (PF) 19,400 UNIT SUB-Q SOLN
19400 unit/0.65 mL | Freq: Once | SUBCUTANEOUS | Status: AC
Start: 2011-10-19 — End: 2011-10-19

## 2011-10-19 NOTE — Progress Notes (Signed)
Patient is here today for scheduled physical, but wants to discuss.

## 2011-10-19 NOTE — Progress Notes (Signed)
HISTORY OF PRESENT ILLNESS  Stanley Holt is a 71 y.o. male.  HPI He presents today in follow up for AODM, history of prostate cancer, head and neck cancer.  Pt states continues to see urology and most recent PSA had decreased.  Continues to follow up with oncology.   Diabetes Mellitus:also following with endocrine. Reviewed recent labs from endocrine.  He has diabetes mellitus.  Diabetic ROS - medication compliance: compliant all of the time, diabetic diet compliance. noncompliant some of the time.     Current Outpatient Prescriptions   Medication Sig Dispense Refill   ??? glimepiride (AMARYL) 4 mg tablet Take  by mouth every morning.         ??? FERROUS FUMARATE/VIT BCOMP&C (SUPER B COMPLEX PO) Take  by mouth.         ??? glucose blood VI test strips (ASCENSIA AUTODISC VI, ONE TOUCH ULTRA TEST VI) strip 1 Each by Does Not Apply route. Pt testing once daily.  150 Strip  3   ??? metformin ER (GLUCOPHAGE XR) 500 mg tablet Take 500 mg by mouth as directed.  2 qpm  Indications: TYPE 2 DIABETES MELLITUS       ??? ascorbic acid (VITAMIN C) 1,000 mg tablet Take  by mouth.       ??? Saliva Stimulant Agents Comb.3 (BIOTENE MOISTURIZING MOUTH) Spry 1 Spray by Mucous Membrane route once as needed.         Allergies   Allergen Reactions   ??? Chlorhexidine Contact Dermatitis     History   Substance Use Topics   ??? Smoking status: Former Smoker -- 40 years     Quit date: 09/04/2002   ??? Smokeless tobacco: Not on file   ??? Alcohol Use:         Review of Systems   Cardiovascular: Negative for chest pain.   Gastrointestinal: Negative for abdominal pain.   Neurological: Negative for dizziness and headaches.       Physical Exam   Vitals reviewed.  Constitutional: He appears well-developed. No distress.        BP 130/82   Pulse 88   Resp 20   Ht 6' 0.5" (1.842 m)   Wt 236 lb 6.4 oz (107.23 kg)   BMI 31.62 kg/m2   Cardiovascular: Normal rate, regular rhythm, normal heart sounds and intact distal pulses.    No murmur heard.  Pulses:       Carotid pulses  are 2+ on the right side, and 2+ on the left side.       No bruits   Pulmonary/Chest: Effort normal and breath sounds normal. He has no wheezes.   Abdominal: Soft. Bowel sounds are normal. There is no tenderness.   Musculoskeletal: He exhibits no edema.   Psychiatric: He has a normal mood and affect.       ASSESSMENT and PLAN  Encounter Diagnoses   Name Primary?   ??? DM w/o Complication Type II,    well controlled Continue current medication.  Yes   ??? Prostate ca history    ??? Osteoarthritis    ??? Head and neck cancer  Followed by oncology    Requested last abdominal CT scan to review.  No need for screening US for rule out of AAA in this pt with previous hx of smoking if has had CT of abdomen  Orders Placed This Encounter   ??? glimepiride (AMARYL) 4 mg tablet   ??? FERROUS FUMARATE/VIT BCOMP&C (SUPER B COMPLEX PO)   ???  varicella zoster vacine live (ZOSTAVAX) 19,400 unit SusR injection     Follow-up Disposition:  Return in about 1 year (around 10/18/2012) for physical.  current treatment plan is effective, no change in therapy  lab results and schedule of future lab studies reviewed with patient  reviewed diet, exercise and weight control  cardiovascular risk and specific lipid/LDL goals reviewed  reviewed medications and side effects in detail  use of aspirin to prevent MI and TIA's discussed

## 2012-07-16 NOTE — Progress Notes (Signed)
URI x 1 week

## 2012-07-16 NOTE — Progress Notes (Signed)
Subjective:   Stanley Holt is a 71 y.o. male who complains of congestion, post nasal drip and productive cough for 7 days, gradually improving since that time.  He denies a history of chills, fevers, nausea, shortness of breath and wheezing.  FBS 150-160.   Evaluation to date: none.   Treatment to date: OTC products.  Patient does not smoke cigarettes.  Relevant PMH: No pertinent additional PMH.    Current Outpatient Prescriptions   Medication Sig Dispense Refill   ??? ERGOCALCIFEROL, VITAMIN D2, (VITAMIN D2 PO) Take  by mouth.       ??? glimepiride (AMARYL) 4 mg tablet Take  by mouth every morning.         ??? FERROUS FUMARATE/VIT BCOMP&C (SUPER B COMPLEX PO) Take  by mouth.         ??? glucose blood VI test strips (ASCENSIA AUTODISC VI, ONE TOUCH ULTRA TEST VI) strip 1 Each by Does Not Apply route. Pt testing once daily.  150 Strip  3   ??? metformin ER (GLUCOPHAGE XR) 500 mg tablet Take 500 mg by mouth as directed. 2 qpm  Indications: TYPE 2 DIABETES MELLITUS       ??? ascorbic acid (VITAMIN C) 1,000 mg tablet Take  by mouth.       ??? Saliva Stimulant Agents Comb.3 (BIOTENE MOISTURIZING MOUTH) Spry 1 Spray by Mucous Membrane route once as needed.         No current facility-administered medications for this visit.     Allergies   Allergen Reactions   ??? Chlorhexidine Contact Dermatitis        Review of Systems  Pertinent items are noted in HPI.    Objective:     BP 134/78   Pulse 84   Temp(Src) 97.4 ??F (36.3 ??C) (Oral)   Resp 20   Ht 6' 0.5" (1.842 m)   Wt 232 lb 12.8 oz (105.597 kg)   BMI 31.12 kg/m2   SpO2 95%  General:  alert, cooperative, no distress   Eyes: negative   Ears: normal TM's and external ear canals AU   Sinuses: Sinus nontender, Nasal turbinates erythematous and boggy    Mouth:  Lips, mucosa, and tongue normal. Teeth and gums normal   Neck: supple, symmetrical, trachea midline and no adenopathy.   Heart: normal rate and regular rhythm, S1 and S2 normal.    Lungs: clear to auscultation bilaterally   Abdomen:  soft, non-tender. Bowel sounds normal. No masses,  no organomegaly        Assessment/Plan:   viral upper respiratory illness  Suggested symptomatic OTC remedies.  RTC prn.  Discussed diagnosis and treatment of viral URIs.  Discussed the importance of avoiding unnecessary antibiotic therapy.  Orders Placed This Encounter   ??? ERGOCALCIFEROL, VITAMIN D2, (VITAMIN D2 PO)   .

## 2012-08-23 MED ADMIN — lidocaine-EPINEPHrine (XYLOCAINE) 1 %-1:100,000 injection 15 mg: SUBCUTANEOUS | NDC 00409317801

## 2012-08-23 MED ADMIN — diph,Pertuss(AC),Tet Vac-PF (BOOSTRIX) suspension 0.5 mL: INTRAMUSCULAR | @ 23:00:00 | NDC 58160084243

## 2012-08-23 MED ADMIN — bacitracin 500 unit/gram packet: NDC 45802006070

## 2012-08-23 MED FILL — BACITRACIN ZINC 500 UNIT/G OINTMENT: 500 unit/gram | CUTANEOUS | Qty: 15

## 2012-08-23 MED FILL — LIDOCAINE-EPINEPHRINE 1 %-1:100,000 IJ SOLN: 1 %-:00,000 | INTRAMUSCULAR | Qty: 20

## 2012-08-23 MED FILL — BOOSTRIX TDAP 2.5 LF UNIT-8 MCG-5 LF/0.5 ML INTRAMUSCULAR SUSPENSION: INTRAMUSCULAR | Qty: 1

## 2012-08-23 MED FILL — BACITRACIN 500 UNIT/G TOPICAL PACKET: 500 unit/gram | CUTANEOUS | Qty: 1

## 2012-08-23 NOTE — ED Notes (Signed)
Lac tray at bedside.

## 2012-08-23 NOTE — ED Notes (Signed)
I have reviewed discharge instructions with the patient.  The patient verbalized understanding.

## 2012-08-23 NOTE — ED Provider Notes (Signed)
HPI Comments: 71 y/o Caucasian male presents to the ED for the evaluation of minor head injury and laceration to right eyebrow.  According to patient, he was outside when he states he accidentally tripped in the front yard on a vine causing him to fall forward and striking his head on the edge of a bird feeder he was carrying.  He states the bird feeder hit his glasses and his glasses struck his face.  He states the glasses did not break.  No LOC.  No neck pain/stiffness.  He is not on any blood thinners.  No other injuries noted.  He is not up to date on his tetanus shot.  No other acute medical complaints expressed at this time.         Past Medical History   Diagnosis Date   ??? Microalbuminuria    ??? Diabetes    ??? Unspecified essential hypertension 06/23/2008   ??? Osteoarthritis 02/03/2009   ??? Microalbuminuria    ??? Cancer 03/2009     throat cancer.        Past Surgical History   Procedure Laterality Date   ??? Hx hernia repair       bilateral   ??? Hx orthopaedic  06/2007     lumbar laminectomy   ??? Hx orthopaedic       right shoulder surgery   ??? Endoscopy, colon, diagnostic           Family History   Problem Relation Age of Onset   ??? Cancer Mother      breast   ??? Diabetes Brother    ??? Arthritis-rheumatoid Father    ??? Arthritis-rheumatoid Sister         History     Social History   ??? Marital Status: MARRIED     Spouse Name: N/A     Number of Children: N/A   ??? Years of Education: N/A     Occupational History   ??? Not on file.     Social History Main Topics   ??? Smoking status: Former Smoker -- 40 years     Quit date: 09/04/2002   ??? Smokeless tobacco: Not on file   ??? Alcohol Use:    ??? Drug Use:    ??? Sexually Active:      Other Topics Concern   ??? Not on file     Social History Narrative   ??? No narrative on file                  ALLERGIES: Chlorhexidine      Review of Systems   Constitutional: Negative.    HENT: Negative for facial swelling, neck pain and neck stiffness.    Eyes: Negative.    Respiratory: Negative for chest  tightness and shortness of breath.    Cardiovascular: Negative for chest pain.   Gastrointestinal: Negative for nausea and vomiting.   Musculoskeletal: Negative.  Negative for back pain and gait problem.   Skin: Positive for wound. Negative for rash.   Neurological: Negative for dizziness, weakness, light-headedness, numbness and headaches.   Hematological: Does not bruise/bleed easily.   Psychiatric/Behavioral: Negative.    All other systems reviewed and are negative.        Filed Vitals:    08/23/12 1645   BP: 140/90   Pulse: 85   Temp: 97.7 ??F (36.5 ??C)   Resp: 16   Height: 6\' 1"  (1.854 m)   Weight: 105.235 kg (232 lb)   SpO2:  95%            Physical Exam   Nursing note and vitals reviewed.  Constitutional: He is oriented to person, place, and time. He appears well-developed and well-nourished. No distress.   HENT:   Head: Normocephalic. Head is with laceration.       Right Ear: Hearing, tympanic membrane, external ear and ear canal normal. Tympanic membrane is not erythematous. No hemotympanum.   Left Ear: Hearing, tympanic membrane, external ear and ear canal normal. Tympanic membrane is not erythematous. No hemotympanum.   Nose: Nose normal.   Mouth/Throat: Oropharynx is clear and moist. No oropharyngeal exudate.   Eyes: EOM are normal. Pupils are equal, round, and reactive to light.   Neck: Normal range of motion and full passive range of motion without pain. Neck supple. No spinous process tenderness and no muscular tenderness present. No rigidity. No edema, no erythema and normal range of motion present.   Cardiovascular: Normal rate, regular rhythm, normal heart sounds and intact distal pulses.  Exam reveals no gallop and no friction rub.    No murmur heard.  Pulmonary/Chest: Effort normal and breath sounds normal. No respiratory distress.   Musculoskeletal: Normal range of motion.   Neurological: He is alert and oriented to person, place, and time. He has normal strength and normal reflexes. He is not  disoriented. No cranial nerve deficit or sensory deficit. Coordination and gait normal. GCS eye subscore is 4. GCS verbal subscore is 5. GCS motor subscore is 6.   Skin: Skin is warm and dry. No rash noted. No erythema.   Psychiatric: He has a normal mood and affect. His behavior is normal. Judgment and thought content normal.        MDM    WOUND REPAIR  Date/Time: 08/23/2012 6:41 PM  Performed by: PASupervising provider: Dr. Derek Mound  Preparation: skin prepped with Betadine and sterile field established  Pre-procedure re-eval: Immediately prior to the procedure, the patient was reevaluated and found suitable for the planned procedure and any planned medications.  Time out: Immediately prior to the procedure a time out was called to verify the correct patient, procedure, equipment, staff and marking as appropriate..  Location details: right eyebrow  Wound length:2.6 - 7.5 cm (~ 3 cm)  Anesthesia: local infiltration  Local anesthetic: lidocaine 1% with epinephrine  Anesthetic total: 3 ml  Foreign bodies: no foreign bodies  Irrigation solution: saline  Irrigation method: syringe  Debridement: none  Skin closure: gut (5-0 fast absoring plain gut)  Number of sutures: 6  Technique: simple and interrupted  Approximation: close  Dressing: antibiotic ointment  Patient tolerance: Patient tolerated the procedure well with no immediate complications.  My total time at bedside, performing this procedure was 1-15 minutes.        Discussed patient including complaint, history, physical exam, test results and diagnosis and plan including treatment with attending physician.  Attending agrees with care and plan.  Discussed with Dr. Dionicio Stall, PA    Reviewed dx with patient.  Patient verbalizes understanding of dx and is aware of what s/sx to monitor that would warrant return visit to ED  Marylee Floras, PA

## 2012-08-23 NOTE — ED Notes (Signed)
Pt reports tripping in the yard on a vine and hitting head on the edge of a bird feeder he was carrying.  Pt denies LOC.  LAC note to right brow area.  Bleeding controlled.

## 2012-08-23 NOTE — ED Provider Notes (Signed)
I was personally available for consultation in the emergency department.  I have reviewed the chart and agree with the documentation recorded by the MLP, including the assessment, treatment plan, and disposition.  Liandro Thelin F. Devan Babino, MD

## 2012-08-23 NOTE — ED Notes (Signed)
Provider at bedside suturing laceration.

## 2012-08-26 NOTE — Progress Notes (Signed)
Follow up phone call for post ED visit at [facility] on 12/20 for laceration of right eyebrow.    Patient arrived to ED after tripping in the yard and hitting head above right eye brow on the edge of a bird feeder he was carrying.  Denies LOC.  Bleeding controlled.  Patient received 6 sutures in ED.  Patient received tetanus booster.  Patient d/c home with norco for pain control.  Called patient. Verified name, DOB, address. Says his incision is about an inch or so.  Indicated the top of this head is still numb from the lidocaine injection he received before getting the sutures.  He indicated that he has been taking advil for pain control.  Medication reconciliation completed.  Patient indicated that the sutures he was given were absorbant so there is no need for him to f/u with PCP.  Patient on his way to [facility] for Christmas.  No other issues or concerns conveyed at this time.

## 2012-10-22 NOTE — Progress Notes (Signed)
physical

## 2012-10-22 NOTE — Patient Instructions (Addendum)
Preventing falls (The Basics)Written by the doctors and editors at [employer]       Am I at risk of falling?     ??? Your risk of falling increases as you grow older. That???s because getting older can make it harder to walk steadily and keep your balance. Also, the effects of falls are more serious in older people.   Overall, 3 to 4 out of every 10 people over the age of 65 fall each year. Up to 75 percent of people who fracture a hip never recover to the point they were before they had their fracture. If you have fallen in the past, you are at higher risk of falling again.    Several things can increase your risk of a fall, including:  Illness   A change in the medicines you take   An unsafe or unfamiliar setting (for example, a room with rugs or furniture that might trip you, or an area you don???t know well)    How can my doctor help me to avoid falling?     ??? Your doctor can talk to you about the following things:  Past falls ??? It is important to tell your doctor about any times you have fallen or almost fallen. He or she can then suggest ways to prevent another fall.   Your health conditions ??? Some health problems can put you at risk of falling. These include conditions that affect eyesight, hearing, muscle strength, or balance.   The medicines you take ??? Certain medicines can increase the risk of falling. These include some medicines that are used for sleeping problems, anxiety, or depression. Adding new medicines, or changing doses of some medicines, can also affect your risk of falling.    The more your doctor knows about your situation, the better he or she will be able to help you. For example, if you fell because you have a condition that causes pain, your doctor might suggest treatments to deal with the pain. Or if 1 of your medicines is making you dizzy and more likely to fall, your doctor might switch you to a different medicine.    Is there anything I can do on my own?     ??? Yes. To help keep  from falling, you can:  Make your home safer ??? To avoid falling at home, get rid of things that might make you trip or slip. This might include furniture, electrical cords, clutter, and loose rugs. Keep your home well lit so that you can easily see where you are going. Avoid storing things in high places so you don???t have to reach or climb.   Wear sturdy shoes that fit well ??? Wearing shoes with high heels or slippery soles, or shoes that are too loose can lead to falls. Walking around in bare feet, or only socks, can also increase your risk of falling.   Take vitamin D pills ??? Taking vitamin D might lower the risk of falls in older people. This is because vitamin D helps make bones and muscles stronger. Your doctor can help you decide how much vitamin D to take.   Stay active ??? Exercising on a regular basis can help lower your risk of falling. It is best to do a few different activities that help with both strength and balance. There are many kinds of exercise that can be safe for older people. These include walking, swimming, and Tai Chi (a Chinese martial art that involves slow, gentle movements).     Use a cane, walker, and other safety devices ??? If your doctor recommends that you use a cane or walker, be sure that it???s the right size and you know how to use it. There are other devices that might help you avoid falling, too. These include grab bars or a sturdy seat for the shower, and hand rails or treads for the stairs (to prevent slipping).    If you worry that you could fall, there are also alarm buttons that let you call for help if you fall and can???t get up.    What should I do if I fall?     ??? If you fall, see your doctor right away, even if you aren???t hurt. Your doctor can try to figure out what caused you to fall, and how likely you are to fall again. He or she will do an exam and talk to you about your health problems, medicines, and activities. Then he or she can suggest things you can do to avoid falling  again.  Many older people have a hard time recovering after a fall. Doing things to prevent falling can help you to protect your health and independence.      Muscle Conditioning: Exercises  Your Care Instructions  Here are some examples of exercises for muscle conditioning. Start each exercise slowly. Ease off the exercise if you start to have pain.  Your doctor or physical therapist will tell you when you can start these exercises and which ones will work best for you.  How to do the exercises  Wall push-ups    1. Stand facing a wall, about 12 to 18 inches away.  2. Place your hands on the wall at shoulder height.  3. Slowly bend your elbows and bring your face toward the wall, moving your hips and shoulders forward together.  4. Push slowly back to the starting position.  5. Start with 5 repetitions and work up to 8 to 12.  6. Rest for a minute, and repeat the exercise.  Note: When you can do this exercise against a wall comfortably (without your muscles feeling tired), you can try it against a counter. Start with 5 repetitions again and work up to 8 to 12. You can then slowly progress to the end of a couch or a sturdy chair, and finally to the floor.  Knee extension    1. While sitting in a chair, straighten one leg and hold while you slowly count to 5. Be sure you do not lock your knee.  2. Repeat 8 to 12 times.  3. Rest for a minute, and repeat the exercise.  4. Do the same exercise with the other leg.  Note: If this exercise becomes easy, you can add a light weight around your ankle or tie an elastic resistance band to a chair leg and one ankle.  Side-lying leg lift    1. Lie on your side, with your legs extended. Keep your hips straight up and down during this exercise. Do not let your top hip rock toward the back. Support your head with your hand, and place the other hand on the floor near your waist.  2. Slowly raise your upper leg until it is about in line with your shoulder. Keep your toes pointed  forward.  3. Slowly lower your leg to the starting position.  4. Repeat 8 to 12 times.  5. Rest for a minute, and repeat the exercise.  6. Turn to your other side and   do the same exercise with your other leg.  Note: If this exercise becomes easy, you can add a light weight around your ankle or tie an elastic resistance band to both ankles.  Shallow standing knee bends    1. Stand with your hands lightly resting on a counter or chair in front of you with your feet shoulder-width apart.  2. Slowly bend your knees so that you squat down just like you were going to sit in a chair. Make sure your knees do not go in front of your toes.  3. Lower yourself about 6 inches. Your heels should remain on the floor at all times.  4. Rise slowly to a standing position.  5. Repeat 8 to 12 times.  6. Rest for a minute, and repeat the exercise.  Follow-up care is a key part of your treatment and safety. Be sure to make and go to all appointments, and call your doctor if you are having problems. It's also a good idea to know your test results and keep a list of the medicines you take.     Advance Directives: After Your Visit  Your Care Instructions  An advance directive is a statement that lets people know your wishes for end-of-life care. It is used when you cannot speak or express yourself, such as if you are in a coma. An advance directive can be a spoken statement or a written document.  One type of advance directive is a living will. It expresses your wishes about medical treatment in case you cannot speak. It explains if and when you want life support and other treatment. Another type of advance directive appoints a person (called a durable power of attorney or a health care agent) who can make treatment decisions for you.  You can change or end a living will at any time.  Do not assume that your doctor and family know your desires about end-of-life care. An advance directive helps your loved ones make difficult decisions for  you. If you do not have a living will and a health care agent, decisions about your medical care may be made by a doctor or a judge who does not know you.  Follow-up care is a key part of your treatment and safety. Be sure to make and go to all appointments, and call your doctor if you are having problems. It???s also a good idea to know your test results and keep a list of the medicines you take.  How can you care for yourself at home?  ?? Discuss your wishes with all of your family members and your doctor so they know what you want. The people making decisions for you should not be surprised by your choices.  ?? Each state has different guidelines for advance directives. Make sure that your directive follows the rules of the state you live in.  ?? You do not need a lawyer to complete an advance directive. But you may want to get legal advice.  ?? Consider the following questions when preparing an advance directive:  ?? Who do you want to make decisions about your medical care if you are not able to? Many people choose a family member, close friend, or doctor.  ?? Do you know enough about life support methods that might be used? If not, talk to your doctor so you understand.  ?? What are you most afraid of that might happen? You might be afraid of having pain, losing your independence, or being kept  alive by machines.  ?? Where would you prefer to die? Choices include your home, a hospital, or a nursing home.  ?? Would you like to have information about hospice care to support you and your family?  ?? Do you want to donate organs when you die?  ?? Do you want certain religious practices performed before you die? If so, put your wishes in the advance directive.  ?? Read your advance directive every year, and make changes as needed.  When should you call for help?  Be sure to contact your doctor if you have any questions.   Where can you learn more?   Go to [email or web address]  Enter R264 in the search box to  learn more about "Advance Directives: After Your Visit."   ?? 2006-2013 [employer]. Care instructions adapted under license by [employer] (which disclaims liability or warranty for this information). This care instruction is for use with your licensed healthcare professional. If you have questions about a medical condition or this instruction, always ask your healthcare professional. [employer] disclaims any warranty or liability for your use of this information.  Content Version: 9.9.209917; Last Revised: August 25, 2010

## 2012-10-22 NOTE — Progress Notes (Signed)
This is a Subsequent Medicare Annual Wellness Visit providing Personalized Prevention Plan Services (PPPS) (Performed 12 months after initial AWV and PPPS )    I have reviewed the patient's medical history in detail and updated the computerized patient record.   He also presents today in follow up for AODM, history of prostate cancer, head and neck cancer.  Pt states continues to see urology and most recent PSA had decreased.  Continues to follow up with oncology for head and neck cancer and no evidence of recurrence.  Diabetes Mellitus:also following with endocrine. Reviewed recent labs from endocrine.   He has diabetes mellitus.   Diabetic ROS - medication compliance: compliant all of the time, diabetic diet compliance. noncompliant some of the time.   Reviewed recent lab work by endocrine which is at goal and is scanned to chart.   History     Past Medical History   Diagnosis Date   ??? Microalbuminuria    ??? Diabetes    ??? Unspecified essential hypertension 06/23/2008   ??? Osteoarthritis 02/03/2009   ??? Microalbuminuria    ??? Cancer 03/2009     throat cancer.      Past Surgical History   Procedure Laterality Date   ??? Hx hernia repair       bilateral   ??? Hx orthopaedic  06/2007     lumbar laminectomy   ??? Hx orthopaedic       right shoulder surgery   ??? Endoscopy, colon, diagnostic       Current Outpatient Prescriptions   Medication Sig Dispense Refill   ??? ERGOCALCIFEROL, VITAMIN D2, (VITAMIN D2 PO) Take  by mouth.       ??? glimepiride (AMARYL) 4 mg tablet Take  by mouth every morning.         ??? FERROUS FUMARATE/VIT BCOMP&C (SUPER B COMPLEX PO) Take  by mouth.         ??? glucose blood VI test strips (ASCENSIA AUTODISC VI, ONE TOUCH ULTRA TEST VI) strip 1 Each by Does Not Apply route. Pt testing once daily.  150 Strip  3   ??? metformin ER (GLUCOPHAGE XR) 500 mg tablet Take 500 mg by mouth as directed. 2 qpm  Indications: TYPE 2 DIABETES MELLITUS       ??? ascorbic acid (VITAMIN C) 1,000 mg tablet Take  by mouth.       ??? Saliva  Stimulant Agents Comb.3 (BIOTENE MOISTURIZING MOUTH) Spry 1 Spray by Mucous Membrane route once as needed.         Allergies   Allergen Reactions   ??? Chlorhexidine Contact Dermatitis     Family History   Problem Relation Age of Onset   ??? Cancer Mother      breast   ??? Diabetes Brother    ??? Arthritis-rheumatoid Father    ??? Arthritis-rheumatoid Sister      History   Substance Use Topics   ??? Smoking status: Former Smoker -- 40 years     Quit date: 09/04/2002   ??? Smokeless tobacco: Not on file   ??? Alcohol Use: 0.5 oz/week     1 drink(s) per week     Patient Active Problem List   Diagnosis Code   ??? DM w/o Complication Type II, Uncontrolled 250.02   ??? 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 Prostate ca 185   ??? Head and neck cancer 195.0       Depression Risk Factor Screening:  Patient Health Questionnaire (PHQ-2)   Over the last 2 weeks, how often have you been bothered by any of the following problems?  ?? Little interest or pleasure in doing things?  ?? Not at all. [0]  ?? Feeling down, depressed, or hopeless?   ?? Not at all. [0]    Total Score: 0/6    Alcohol Risk Factor Screening:     On any occasion during the past 3 months, have you had more than 4 drinks containing alcohol?  No    Do you average more than 14 drinks per week?  No    Review of Systems   Review of Systems   Constitutional: Negative for fever, chills, weight loss and malaise/fatigue.   HENT: Negative for congestion and sore throat.    Respiratory: Negative for cough and shortness of breath.    Cardiovascular: Negative for chest pain and leg swelling.   Gastrointestinal: Negative for heartburn, nausea, vomiting, abdominal pain, diarrhea, constipation and blood in stool.   Musculoskeletal: Positive for back pain (ocasionall low back pain) and falls (he fell this past fall tripped on canvas. also had a fall when tripped  on  bird bath.  he did have to have stitches.).   Neurological: Negative for dizziness and headaches.    Psychiatric/Behavioral: Negative for depression.         Examination   Physical Examination    Evaluation of Cognitive Function:  Mood/affect:  happy  Appearance: age appropriate  Family member/caregiver input: none    BP 118/60   Pulse 77   Temp(Src) 97.5 ??F (36.4 ??C) (Oral)   Resp 20   Ht 6' 0.5" (1.842 m)   Wt 236 lb (107.049 kg)   BMI 31.55 kg/m2   SpO2 97%  General appearance  alert, cooperative, no distress, appears stated age   Head  Normocephalic, without obvious abnormality, atraumatic   Eyes  conjunctivae/corneas clear. PERRL, EOM's intact. Fundi benign   Ears  normal TM's and external ear canals AU   Nose Nares normal.. Mucosa normal. No drainage or sinus tenderness.   Throat Lips, mucosa, and tongue normal.    Neck supple, symmetrical, trachea midline, no adenopathy, thyroid: not enlarged, symmetric, no tenderness/mass/nodules, no carotid bruit and no JVD   Back   symmetric, no curvature. ROM normal. No CVA tenderness   Lungs   clear to auscultation bilaterally   Chest wall  no tenderness   Heart  regular rate and rhythm, S1, S2 normal, no murmur, click, rub or gallop   Abdomen   soft, non-tender. Bowel sounds normal. No masses,  No organomegaly,  Left inguinal hernia   Genitalia  GU Male exam: pt defers to urology.    Rectal  Rectal exam: defers to urology.    Extremities extremities normal, atraumatic, no cyanosis or edema   Pulses 2+ and symmetric   Skin  No rashes or lesions   Lymph nodes Cervical, supraclavicular   Neurologic Normal        Patient Care Team:  Johnsie Kindred, MD as PCP - General  Harlen Labs, MD (Hematology and Oncology)  Candee Furbish, MD (Radiation Oncology)  Verlon Setting, MD (Urology)  Haskell Riling, MD (Otolaryngology)  Jamesetta So (Internal Medicine)    Advice/Referrals/Counseling:   Education and counseling provided:  Are appropriate based on today's review and evaluation  End-of-Life planning (with patient's consent)    Assessment/Plan:   Sahas was seen today  for physical.    Diagnoses  and associated orders for this visit:    Routine general medical examination at a health care facility    DM w/o Complication Type II, controlled  Followed by endocrine. Continue current medication. And reviewed recent lab work.    Screening for depression    Screening for alcoholism  - Annual  Alcohol Screen 15 min (A5409)    Head and neck cancer  Stable no recurrence  Prostate cancer history  Followed by urology  - aspirin delayed-release 81 mg tablet; Take 1 Tab by mouth daily.        Follow-up Disposition:  Return in about 1 year (around 10/22/2013) for physical.  lab results and schedule of future lab studies reviewed with patient  reviewed diet, exercise and weight control  cardiovascular risk and specific lipid/LDL goals reviewed  reviewed medications and side effects in detail  use of aspirin to prevent MI and TIA's discussed  radiology results and schedule of future radiology studies reviewed with patient.

## 2012-11-02 LAB — OCCULT BLOOD, STOOL: Occult blood fecal, by IA: NEGATIVE

## 2013-02-07 NOTE — Progress Notes (Signed)
Thirty day follow up for post ED episode dated 08/26/12 for laceration above right eye.  Pt came in for MWV on 10/22/12 with Dr. Cliffton Asters.  This will close the episode.

## 2013-02-25 LAB — AMB EXT CREATININE: Creatinine, External: 1.1

## 2013-02-25 LAB — AMB EXT HGBA1C: Hemoglobin A1c, External: 6.8

## 2013-02-25 LAB — AMB EXT URINE MICROALBUMIN: Urine Microalbumin, External: 58.4

## 2013-05-26 NOTE — ED Notes (Signed)
Triage Note: Patient reports he had a cyst removed from his back today and they did not put the bandage on well enough. Pt is having bleeding through bandage.

## 2013-05-26 NOTE — ED Notes (Signed)
Dressing reinforced in triage with 4x4's

## 2013-05-26 NOTE — ED Notes (Signed)
Wound site covered with 4x4 gauze and taped with cloth tape

## 2013-05-26 NOTE — ED Provider Notes (Signed)
HPI Comments: 72 y.o. male with PMH significant for diabetes, microalbuminuria, OA and throat CA presents to the ED from home with cc of post op complication. Pt underwent sebaceous cyst removal today by Dr. Rozann Lesches. This evening he noticed bandage had come off and there was significant bleeding from op site. Denies rubbing site or injury. Denies any other complaints at this time and requests wound be redressed.     There are no other complaints or pertinent physical findings at this time.    Surgical Hx is significant for: bilateral hernia repair, lumbar laminectomy, R shoulder surgery, endoscopy, sebaceous cyst removal    PCP: Theron Arista WHITE, MD  Social hx: former smoker, occasional etoh use, no illicit drug use    Note written by Shara Blazing, Scribe, as dictated by Arbutus Ped, MD 8:41 PM          The history is provided by the patient and the nursing home.        Past Medical History   Diagnosis Date   ??? Microalbuminuria    ??? Diabetes    ??? Unspecified essential hypertension 06/23/2008   ??? Osteoarthritis 02/03/2009   ??? Microalbuminuria    ??? Cancer 03/2009     throat cancer.        Past Surgical History   Procedure Laterality Date   ??? Hx hernia repair       bilateral   ??? Hx orthopaedic  06/2007     lumbar laminectomy   ??? Hx orthopaedic       right shoulder surgery   ??? Endoscopy, colon, diagnostic           Family History   Problem Relation Age of Onset   ??? Cancer Mother      breast   ??? Diabetes Brother    ??? Arthritis-rheumatoid Father    ??? Arthritis-rheumatoid Sister         History     Social History   ??? Marital Status: MARRIED     Spouse Name: N/A     Number of Children: N/A   ??? Years of Education: N/A     Occupational History   ??? Not on file.     Social History Main Topics   ??? Smoking status: Former Smoker -- 40 years     Quit date: 09/04/2002   ??? Smokeless tobacco: Not on file   ??? Alcohol Use: 0.5 oz/week     1 drink(s) per week   ??? Drug Use: Not on file   ??? Sexually Active: Not on file     Other Topics  Concern   ??? Not on file     Social History Narrative   ??? No narrative on file                  ALLERGIES: Chlorhexidine      Review of Systems   Constitutional: Negative for fever.   HENT: Negative for neck stiffness.    Eyes: Negative for visual disturbance.   Respiratory: Negative for cough, shortness of breath and wheezing.    Cardiovascular: Negative for chest pain and leg swelling.   Gastrointestinal: Negative for nausea, vomiting, abdominal pain and diarrhea.   Genitourinary: Negative for dysuria.   Musculoskeletal: Negative for back pain.   Skin: Positive for wound (back surgical wound). Negative for rash.   Neurological: Negative for syncope and headaches.   Psychiatric/Behavioral: Negative for confusion.   All other systems reviewed and are negative.  Filed Vitals:    05/26/13 1859   BP: 122/67   Pulse: 80   Temp: 99.2 ??F (37.3 ??C)   Resp: 18   Height: 6\' 2"  (1.88 m)   Weight: 104.327 kg (230 lb)   SpO2: 95%            Physical Exam   Nursing note and vitals reviewed.  Constitutional: He is oriented to person, place, and time. He appears well-developed and well-nourished. No distress.   HENT:   Head: Normocephalic and atraumatic.   Eyes: Conjunctivae are normal. No scleral icterus.   Neck: Neck supple. No tracheal deviation present.   Cardiovascular: Normal rate, regular rhythm, normal heart sounds and intact distal pulses.  Exam reveals no gallop and no friction rub.    No murmur heard.  Pulmonary/Chest: Effort normal and breath sounds normal. He has no wheezes. He has no rales.   Abdominal: Soft. He exhibits no distension. There is no tenderness. There is no rebound and no guarding.   Musculoskeletal: He exhibits no edema.   Fresh upper mid back surgical wound   Neurological: He is alert and oriented to person, place, and time.   Skin: Skin is warm and dry. No rash noted.   Psychiatric: He has a normal mood and affect.        MDM    Procedures

## 2013-05-26 NOTE — ED Notes (Signed)
Discharge instructions given to pt.  All questions answered and pt verbalized understanding.   V/S stable @ time of discharge.  Pt ambulatory out of unit.

## 2013-05-28 LAB — AMB EXT HGBA1C: Hemoglobin A1c, External: 7.5

## 2013-05-28 LAB — AMB EXT LDL-C: LDL-C, External: 74

## 2013-05-28 LAB — AMB EXT CREATININE: Creatinine, External: 0.95

## 2013-05-29 NOTE — Telephone Encounter (Signed)
[facility] Emergency Department Follow Up Call Record    Discharged to : Home/Family Home/Home Health/Skilled Facility/Rehab/Assisted Living/Other___Home____  1) Did you receive your discharge instructions? YES        2) Do you understand them? YES         3) Are you able to follow them?YES          If NO, what can I clarify for you?  4) Do you understand your diagnosis?YES         5) Do you know which symptoms should prompt you to call the doctor?YES     6) Were you able to fill and pick up any medications that were prescribed? NOT APPLICABLE     7) You were prescribed ___________for ____________________.  Common side effects of this medication are____________________. This is not a complete list so please review the forms given from the pharmacy for a complete list.      8) Are there any questions about your medications? NO            Have you scheduled any recommended doctor???s appointments (specialty, PCP)  YES   I've seen my MD twicie  If NO, what barriers are you encountering (transportation/lost contact info/cost/  didn???t think necessary/no PCP  9) If discharged with Home Health, has the agency contacted you to schedule visit?  N/A   10) Is there anyone available to help you at home (meals, errands, transportation    monitoring) (adult children, neighbors, private duty companions) YES    11) Are you on a special diet? NO         If YES, do you understand the requirements for this diet?       Education provided?  12) If presented with cough, bronchitis, COPD, asthma, is it ok to ask that the   respiratory disease management educator call you? NOT APPLICABLE      13)  A) If presented with fall, were you issued an assistive device in the ED    Are you using?NO  B) If given RX for device, have you obtained?NO       If NO, barriers?  C) Therapist recommended:   Are you able to implement the suggestions? NOT APPLICABLE        If NO, barriers to implementation?     D) Are you having any difficulties with  mobility inside your home?     (steps, bed, tub)NOT APPLICABLE   If YES, ask if the [facility] PT can contact patient and good time and number?  14)  At the end of your discharge instructions, there is information about accessing [facility], have you had a chance to review those? YES         Do you have any questions about signing up for this service?    We encourage our patients to be active participants in their healthcare and this site is one of the ways to do that.  It will allow you to access parts of your medical record, email your doctor???s office, schedule appointments, and request medications refills .    15) Are there any other questions that I can answer for you regarding    your Emergency department visit? NO             Estimated Call Time:____9/25/14_______________ Date/Time:_______________     Truddie Hidden, BSW, CRM

## 2013-08-26 LAB — AMB EXT HGBA1C: Hemoglobin A1c, External: 6.6

## 2013-08-26 LAB — AMB EXT CREATININE: Creatinine, External: 1.04

## 2013-09-25 NOTE — Telephone Encounter (Signed)
New referral, record review in Epic, outreach call, spoke with pt, explained program and benefits, pt states 'I don't need it.' Closed.  Geronimo Running, LPN  [place]  [phone]

## 2013-10-23 NOTE — Patient Instructions (Addendum)
Preventing falls (The Basics)Written by the doctors and editors at [employer]       Am I at risk of falling?     ??? Your risk of falling increases as you grow older. That???s because getting older can make it harder to walk steadily and keep your balance. Also, the effects of falls are more serious in older people.   Overall, 3 to 4 out of every 10 people over the age of 34 fall each year. Up to 51 percent of people who fracture a hip never recover to the point they were before they had their fracture. If you have fallen in the past, you are at higher risk of falling again.    Several things can increase your risk of a fall, including:  Illness   A change in the medicines you take   An unsafe or unfamiliar setting (for example, a room with rugs or furniture that might trip you, or an area you don???t know well)    How can my doctor help me to avoid falling?     ??? Your doctor can talk to you about the following things:  Past falls ??? It is important to tell your doctor about any times you have fallen or almost fallen. He or she can then suggest ways to prevent another fall.   Your health conditions ??? Some health problems can put you at risk of falling. These include conditions that affect eyesight, hearing, muscle strength, or balance.   The medicines you take ??? Certain medicines can increase the risk of falling. These include some medicines that are used for sleeping problems, anxiety, or depression. Adding new medicines, or changing doses of some medicines, can also affect your risk of falling.    The more your doctor knows about your situation, the better he or she will be able to help you. For example, if you fell because you have a condition that causes pain, your doctor might suggest treatments to deal with the pain. Or if 1 of your medicines is making you dizzy and more likely to fall, your doctor might switch you to a different medicine.    Is there anything I can do on my own?     ??? Yes. To help keep  from falling, you can:  Make your home safer ??? To avoid falling at home, get rid of things that might make you trip or slip. This might include furniture, electrical cords, clutter, and loose rugs. Keep your home well lit so that you can easily see where you are going. Avoid storing things in high places so you don???t have to reach or climb.   Wear sturdy shoes that fit well ??? Wearing shoes with high heels or slippery soles, or shoes that are too loose can lead to falls. Walking around in bare feet, or only socks, can also increase your risk of falling.   Take vitamin D pills ??? Taking vitamin D might lower the risk of falls in older people. This is because vitamin D helps make bones and muscles stronger. Your doctor can help you decide how much vitamin D to take.   Stay active ??? Exercising on a regular basis can help lower your risk of falling. It is best to do a few different activities that help with both strength and balance. There are many kinds of exercise that can be safe for older people. These include walking, swimming, and Tai Chi (a [place] martial art that involves slow, gentle movements).  Use a cane, walker, and other safety devices ??? If your doctor recommends that you use a cane or walker, be sure that it???s the right size and you know how to use it. There are other devices that might help you avoid falling, too. These include grab bars or a sturdy seat for the shower, and hand rails or treads for the stairs (to prevent slipping).    If you worry that you could fall, there are also alarm buttons that let you call for help if you fall and can???t get up.    What should I do if I fall?     ??? If you fall, see your doctor right away, even if you aren???t hurt. Your doctor can try to figure out what caused you to fall, and how likely you are to fall again. He or she will do an exam and talk to you about your health problems, medicines, and activities. Then he or she can suggest things you can do to avoid falling  again.  Many older people have a hard time recovering after a fall. Doing things to prevent falling can help you to protect your health and independence.      Muscle Conditioning: Exercises  Your Care Instructions  Here are some examples of exercises for muscle conditioning. Start each exercise slowly. Ease off the exercise if you start to have pain.  Your doctor or physical therapist will tell you when you can start these exercises and which ones will work best for you.  How to do the exercises  Wall push-ups    ?? Stand facing a wall, about 12 to 18 inches away.  ?? Place your hands on the wall at shoulder height.  ?? Slowly bend your elbows and bring your face toward the wall, moving your hips and shoulders forward together.  ?? Push slowly back to the starting position.  ?? Start with 5 repetitions and work up to 8 to 12.  ?? Rest for a minute, and repeat the exercise.  Note: When you can do this exercise against a wall comfortably (without your muscles feeling tired), you can try it against a counter. Start with 5 repetitions again and work up to 8 to 12. You can then slowly progress to the end of a couch or a sturdy chair, and finally to the floor.  Knee extension    ?? While sitting in a chair, straighten one leg and hold while you slowly count to 5. Be sure you do not lock your knee.  ?? Repeat 8 to 12 times.  ?? Rest for a minute, and repeat the exercise.  ?? Do the same exercise with the other leg.  Note: If this exercise becomes easy, you can add a light weight around your ankle or tie an elastic resistance band to a chair leg and one ankle.  Side-lying leg lift    1. Lie on your side, with your legs extended. Keep your hips straight up and down during this exercise. Do not let your top hip rock toward the back. Support your head with your hand, and place the other hand on the floor near your waist.  2. Slowly raise your upper leg until it is about in line with your shoulder. Keep your toes pointed forward.  3.  Slowly lower your leg to the starting position.  4. Repeat 8 to 12 times.  5. Rest for a minute, and repeat the exercise.  6. Turn to your other side and  do the same exercise with your other leg.  Note: If this exercise becomes easy, you can add a light weight around your ankle or tie an elastic resistance band to both ankles.  Shallow standing knee bends    1. Stand with your hands lightly resting on a counter or chair in front of you with your feet shoulder-width apart.  2. Slowly bend your knees so that you squat down just like you were going to sit in a chair. Make sure your knees do not go in front of your toes.  3. Lower yourself about 6 inches. Your heels should remain on the floor at all times.  4. Rise slowly to a standing position.  5. Repeat 8 to 12 times.  6. Rest for a minute, and repeat the exercise.  Follow-up care is a key part of your treatment and safety. Be sure to make and go to all appointments, and call your doctor if you are having problems. It's also a good idea to know your test results and keep a list of the medicines you take.     Advance Care Planning: After Your Visit  Your Care Instructions  It can be hard to live with an illness that cannot be cured. But if your health is getting worse, you may want to make decisions about end-of-life care. Planning for the end of your life does not mean that you are giving up. It is a way to make sure that your wishes are met. Clearly stating your wishes can make it easier for your loved ones. Making plans while you are still able may also ease your mind and make your final days less stressful and more meaningful.  Follow-up care is a key part of your treatment and safety. Be sure to make and go to all appointments, and call your doctor if you are having problems. It's also a good idea to know your test results and keep a list of the medicines you take.  What can you do to plan for the end of life?  ?? You can bring these issues up with your doctor.  You do not need to wait until your doctor starts the conversation. You might start with "I would not be willing to live with ...." When you complete this sentence it helps your doctor understand your wishes.  ?? Talk openly and honestly with your doctor. This is the best way to understand the decisions you will need to make as your health changes. Know that you can always change your mind.  ?? Ask your doctor about commonly used life-support measures. These include tube feedings, breathing machines, and fluids given through a vein (IV). Understanding these treatments will help you decide whether you want them.  ?? You may choose to have these life-supporting treatments for a limited time. This allows a trial period to see whether they will help you. You may also decide that you want your doctor to take only certain measures to keep you alive. It is important to spell out these conditions so that your doctor and family understand them.  ?? Talk to your doctor about how long you are likely to live. He or she may be able to give you an idea of what usually happens with your specific illness.  ?? Think about preparing papers that state your wishes. This way there will not be any confusion about what you want. You can change your instructions at any time.  Which papers should you prepare?  Advance directives are legal papers that tell doctors how you want to be cared for at the end of your life. You do not need a lawyer to write these papers. Ask your doctor or your state health department for information on how to write your advance directives. They may have the forms for each of these types of papers. Make sure your doctor has a copy of these on file, and give a copy to a family member or close friend.  ?? Consider a do-not-resuscitate order (DNR). This order asks that no extra treatments be done if your heart stops or you stop breathing. Extra treatments may include electrical shock to restart your heart or a machine to  breathe for you. If you decide to have a DNR order, ask your doctor to explain and write it. Place the order in your home where everyone can easily see it.  ?? Consider a living will. A living will explains your wishes in case you are in a coma or cannot communicate. Living wills tell doctors to use or not use treatments that would keep you alive. You must have one or two witnesses or a notary present when you sign this form.  ?? Consider a durable power of attorney. This allows you to name a person to make decisions about your care if you are not able to. Most people ask a close friend or family member. Talk to this person about the kinds of treatments you want and those that you do not want. Make sure this person understands your wishes. If this person is not the health care agent named in your advance directive, also talk with your health care agent.  These legal papers are simple to change. Tell your doctor what you want to change, and ask him or her to make a note in your medical file. Give your family updated copies of the papers.   Where can you learn more?   Go to [email or web address]  Enter P184 in the search box to learn more about "Advance Care Planning: After Your Visit."   ?? 2006-2014 [employer]. Care instructions adapted under license by [employer] (which disclaims liability or warranty for this information). This care instruction is for use with your licensed healthcare professional. If you have questions about a medical condition or this instruction, always ask your healthcare professional. [place] any warranty or liability for your use of this information.  Content Version: 10.2.346038; Current as of: November 13, 2012

## 2013-10-23 NOTE — Progress Notes (Signed)
Here for CPE.  No problems reported other than arthritis.

## 2013-10-23 NOTE — Progress Notes (Signed)
This is an Initial Medicare Annual Wellness Exam (AWV) (Performed 12 months after IPPE or effective date of Medicare Part B enrollment, Once in a lifetime)    I have reviewed the patient's medical history in detail and updated the computerized patient record.   He is also here for follow up of AODM, osteoarthritis, history of prostate cancer and history of head and neck cancer. He is seeing Dr Ace Gins in endocrinology for diabetes and hypothyroid and last a1c per pt was less than 7. He has been seeing podiatrist and last visit 12/14. He states doing well with medication.  Reviewed recent labs from endocrine with pt today.   He is followed by ENT and oncology for his history of head and neck cancer. No evidence of recurrence.   Prostate cancer history: he is followed by urology. No evidence of recurrence.   History     Past Medical History   Diagnosis Date   ??? Microalbuminuria    ??? Diabetes    ??? Unspecified essential hypertension 06/23/2008   ??? Osteoarthritis 02/03/2009   ??? Microalbuminuria    ??? Cancer 03/2009     throat cancer.   ??? DM w/o Complication Type II, Uncontrolled 06/23/2008   ??? Hypothyroid 10/23/2013   ??? Prostate ca 10/19/2011      Past Surgical History   Procedure Laterality Date   ??? Hx hernia repair       bilateral   ??? Hx orthopaedic  06/2007     lumbar laminectomy   ??? Hx orthopaedic       right shoulder surgery   ??? Endoscopy, colon, diagnostic       Current Outpatient Prescriptions   Medication Sig Dispense Refill   ??? levothyroxine (SYNTHROID) 50 mcg tablet Take  by mouth Daily (before breakfast).       ??? ERGOCALCIFEROL, VITAMIN D2, (VITAMIN D2 PO) Take  by mouth.       ??? glimepiride (AMARYL) 4 mg tablet Take  by mouth every morning.         ??? FERROUS FUMARATE/VIT BCOMP&C (SUPER B COMPLEX PO) Take  by mouth.         ??? glucose blood VI test strips (ASCENSIA AUTODISC VI, ONE TOUCH ULTRA TEST VI) strip 1 Each by Does Not Apply route. Pt testing once daily.  150 Strip  3   ??? metformin ER (GLUCOPHAGE XR) 500 mg  tablet Take 500 mg by mouth as directed. 2 qpm  Indications: TYPE 2 DIABETES MELLITUS       ??? ascorbic acid (VITAMIN C) 1,000 mg tablet Take  by mouth.       ??? Saliva Stimulant Agents Comb.3 (BIOTENE MOISTURIZING MOUTH) Spry 1 Spray by Mucous Membrane route once as needed.       ??? aspirin delayed-release 81 mg tablet Take 1 Tab by mouth daily.  30 Tab  11     Allergies   Allergen Reactions   ??? Chlorhexidine Contact Dermatitis   ??? Tape [Adhesive] Contact Dermatitis     Clear surgical tape over port.     Family History   Problem Relation Age of Onset   ??? Cancer Mother      breast   ??? Diabetes Brother    ??? Arthritis-rheumatoid Father    ??? Arthritis-rheumatoid Sister      History   Substance Use Topics   ??? Smoking status: Former Smoker -- 29 years     Quit date: 09/04/2002   ??? Smokeless tobacco: Not on  file   ??? Alcohol Use: 0.5 oz/week     1 drink(s) per week     Patient Active Problem List   Diagnosis Code   ??? DM w/o Complication Type II, Uncontrolled 250.02   ??? 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 Prostate ca 185   ??? Head and neck cancer 195.0         Depression Risk Factor Screening:     PHQ 2 / 9, over the last two weeks 10/22/2012   Little interest or pleasure in doing things Not at all   Feeling down, depressed or hopeless Not at all   Total Score PHQ 2 0     Alcohol Risk Factor Screening:   On any occasion during the past 3 months, have you had more than 4 drinks containing alcohol?  No    Do you average more than 14 drinks per week?  No    Functional Ability and Level of Safety:     Hearing Loss   moderate    Activities of Daily Living   Self-care.   Requires assistance with: no ADLs    Fall Risk     Fall Risk Assessment, last 12 mths 10/22/2012   Able to walk? Yes   Fall in past 12 months? Yes   Fall with injury? Yes   Number of falls in past 12 months 3     Abuse Screen   Patient is not abused    Review of Systems   Review of Systems   Constitutional: Negative for fever, chills and  malaise/fatigue.   HENT: Positive for hearing loss. Negative for congestion, ear pain and sore throat.    Eyes: Negative for blurred vision and double vision.   Respiratory: Negative for cough, shortness of breath and wheezing.    Cardiovascular: Negative for chest pain, palpitations and leg swelling.   Gastrointestinal: Negative for heartburn, nausea, vomiting, abdominal pain, diarrhea, constipation and blood in stool.   Genitourinary: Negative for dysuria, urgency and frequency.   Musculoskeletal: Positive for back pain (at baseline) and joint pain (osteoarthritis in knees.). Negative for myalgias.   Skin: Negative for rash.   Neurological: Positive for dizziness (on and off vertigo and has been evaluated by ENT.). Negative for sensory change, focal weakness and headaches.   Psychiatric/Behavioral: Negative for depression. The patient is not nervous/anxious and does not have insomnia.         Physical Examination     Evaluation of Cognitive Function:  Mood/affect:  happy  Appearance: age appropriate  Family member/caregiver input: none    BP 112/78    Pulse 80    Temp(Src) 97.7 ??F (36.5 ??C) (Oral)    Resp 16    Ht 6' (1.829 m)    Wt 231 lb (104.781 kg)    BMI 31.32 kg/m2      SpO2 95%   General appearance  alert, cooperative, no distress, appears stated age   Head  Normocephalic, without obvious abnormality, atraumatic   Eyes  conjunctivae/corneas clear. PERRL, EOM's intact. Fundi benign   Ears  normal TM's and external ear canals AU   Nose Nares normal.  Mucosa normal. No drainage or sinus tenderness.   Throat Lips, mucosa, and tongue normal.    Neck supple, symmetrical, trachea midline, no adenopathy, thyroid: not enlarged, symmetric, no tenderness/mass/nodules, no carotid bruit and no JVD   Back   symmetric, no curvature. ROM normal. No CVA tenderness   Lungs   clear  to auscultation bilaterally   Chest wall  no tenderness   Heart  regular rate and rhythm, S1, S2 normal, no murmur, click, rub or gallop   Abdomen    soft, non-tender. Bowel sounds normal. No masses,  No organomegaly   Genitalia   deferred to urology   Rectal  Deferred to urology   Extremities extremities normal, atraumatic, no cyanosis or edema   Pulses Monofilament intact bilaterally. Pulses 2+ bilaterally. No skin lesions or open wounds.      Skin Skin color, texture, turgor normal. No rashes or lesions   Lymph nodes Cervical, supraclavicular, and axillary nodes normal.   Neurologic Normal        Patient Care Team:  Johnsie KindredKelley E Zaid Tomes, MD as PCP - General  Harlen LabsJoseph P Evers, MD (Hematology and Oncology)  Thora LanceGeorge A Trivette, MD (Radiation Oncology)  Verlon SettingJohn P Delisio, MD (Urology)  Haskell Rilinganiel J Vanhimbergen, MD (Otolaryngology)  Jamesetta SoShea W Bethea, MD (Internal Medicine)  Leslee Homeita Stilen    Advice/Referrals/Counseling   Education and counseling provided:  End-of-Life planning (with patient's consent) reviewed importance of  Health care POA and a living well.     Assessment/Plan   Stanley Holt was seen today for complete physical.    Diagnoses and associated orders for this visit:    Routine general medical examination at a health care facility  Counseled regarding importance of maintaining healthy weight,  low fat, low cholesterol diet and exercise.     Screening for depression    Screening for alcoholism    Osteoarthritis knees  Pt requesting handicap parking as getting harder for him to walk any distance without pain.     DM w/o Complication Type II,   Controlled and followed by endocrine. Reviewed recent lab work today.     Prostate ca history   Following with urology regularly    History of head and neck Ca.    Hypothyroid  On thyroid medication and now followed by endocrine      lab results and schedule of future lab studies reviewed with patient  reviewed diet, exercise and weight control  reviewed medications and side effects in detail.

## 2014-02-14 ENCOUNTER — Emergency Department (HOSPITAL_BASED_OUTPATIENT_CLINIC_OR_DEPARTMENT_OTHER): Payer: Medicare Other

## 2014-02-14 ENCOUNTER — Emergency Department (HOSPITAL_BASED_OUTPATIENT_CLINIC_OR_DEPARTMENT_OTHER)
Admission: EM | Admit: 2014-02-14 | Discharge: 2014-02-14 | Disposition: A | Discharge status: Home / Self Care | Payer: Medicare Other | Attending: Emergency Medicine | Admitting: Emergency Medicine

## 2014-02-14 ENCOUNTER — Encounter (HOSPITAL_BASED_OUTPATIENT_CLINIC_OR_DEPARTMENT_OTHER): Payer: Self-pay | Admitting: Emergency Medicine

## 2014-02-14 DIAGNOSIS — R296 Repeated falls: Secondary | ICD-10-CM | POA: Insufficient documentation

## 2014-02-14 DIAGNOSIS — Z8546 Personal history of malignant neoplasm of prostate: Secondary | ICD-10-CM | POA: Insufficient documentation

## 2014-02-14 DIAGNOSIS — Z79899 Other long term (current) drug therapy: Secondary | ICD-10-CM | POA: Insufficient documentation

## 2014-02-14 DIAGNOSIS — E119 Type 2 diabetes mellitus without complications: Secondary | ICD-10-CM | POA: Insufficient documentation

## 2014-02-14 DIAGNOSIS — R079 Chest pain, unspecified: Secondary | ICD-10-CM

## 2014-02-14 DIAGNOSIS — W19XXXA Unspecified fall, initial encounter: Secondary | ICD-10-CM

## 2014-02-14 DIAGNOSIS — R197 Diarrhea, unspecified: Secondary | ICD-10-CM

## 2014-02-14 DIAGNOSIS — S20219A Contusion of unspecified front wall of thorax, initial encounter: Secondary | ICD-10-CM | POA: Insufficient documentation

## 2014-02-14 DIAGNOSIS — Y929 Unspecified place or not applicable: Secondary | ICD-10-CM | POA: Insufficient documentation

## 2014-02-14 DIAGNOSIS — Y939 Activity, unspecified: Secondary | ICD-10-CM | POA: Insufficient documentation

## 2014-02-14 HISTORY — DX: Type 2 diabetes mellitus without complications: E11.9

## 2014-02-14 HISTORY — DX: Malignant (primary) neoplasm, unspecified: C80.1

## 2014-02-14 LAB — AMB EXT CREATININE: Creatinine, External: 1.1

## 2014-02-14 LAB — BASIC METABOLIC PANEL
BUN: 16 mg/dL (ref 6–23)
CALCIUM: 9.9 mg/dL (ref 8.4–10.5)
CO2: 25 meq/L (ref 19–32)
CREATININE: 1.1 mg/dL (ref 0.50–1.35)
Chloride: 103 mEq/L (ref 96–112)
GFR calc Af Amer: 76 mL/min — ABNORMAL LOW (ref >90)
GFR calc non Af Amer: 66 mL/min — ABNORMAL LOW (ref >90)
GLUCOSE: 139 mg/dL — AB (ref 70–99)
Potassium: 3.9 mEq/L (ref 3.7–5.3)
Sodium: 143 mEq/L (ref 137–147)

## 2014-02-14 LAB — CBC
HCT: 37.4 % — ABNORMAL LOW (ref 39.0–52.0)
Hemoglobin: 12.7 g/dL — ABNORMAL LOW (ref 13.0–17.0)
MCH: 32.5 pg (ref 26.0–34.0)
MCHC: 34 g/dL (ref 30.0–36.0)
MCV: 95.7 fL (ref 78.0–100.0)
Platelets: 166 10*3/uL (ref 150–400)
RBC: 3.91 MIL/uL — ABNORMAL LOW (ref 4.22–5.81)
RDW: 13.6 % (ref 11.5–15.5)
WBC: 7.9 10*3/uL (ref 4.0–10.5)

## 2014-02-14 MED ORDER — HYDROMORPHONE HCL PF 1 MG/ML IJ SOLN
1.0000 mg | Freq: Once | INTRAMUSCULAR | Status: AC
  Administered 2014-02-14: 1 mg via INTRAVENOUS
  Filled 2014-02-14: qty 1

## 2014-02-14 MED ORDER — SODIUM CHLORIDE 0.9 % IV BOLUS (SEPSIS)
1000.0000 mL | Freq: Once | INTRAVENOUS | Status: AC
  Administered 2014-02-14: 1000 mL via INTRAVENOUS

## 2014-02-14 MED ORDER — LEVOFLOXACIN 500 MG PO TABS: 500.0000 mg | ORAL_TABLET | Freq: Every day | ORAL | Status: AC

## 2014-02-14 MED ORDER — OXYCODONE-ACETAMINOPHEN 5-325 MG PO TABS: 1.0000 | ORAL_TABLET | ORAL | Status: AC | PRN

## 2014-02-14 MED ORDER — LEVOFLOXACIN 500 MG PO TABS
500.0000 mg | ORAL_TABLET | Freq: Once | ORAL | Status: AC
  Administered 2014-02-14: 500 mg via ORAL
  Filled 2014-02-14: qty 1

## 2014-02-14 MED ORDER — NAPROXEN 500 MG PO TABS: 500.0000 mg | ORAL_TABLET | Freq: Two times a day (BID) | ORAL | Status: AC

## 2014-02-14 NOTE — ED Provider Notes (Signed)
CSN: [id number]     Arrival date & time 02/14/14  1230 History   First MD Initiated Contact with Patient 02/14/14 1236     Chief Complaint  Patient presents with  . Rib Injury      HPI Patient reports falling 3 days ago and landing on his left lateral chest.  He's been having increased pain in his left side when he moves around.   Pain with palpation of the left lateral chest.  No head injury.  No use of anticoagulants.  Denies headache.  No confusion.  Denies neck pain.  No weakness of arms or legs.  Denies abdominal pain.   Past Medical History  Diagnosis Date  . Diabetes mellitus without complication   . Cancer     prostate   History reviewed. No pertinent past surgical history. No family history on file. History  Substance Use Topics  . Smoking status: Never Smoker   . Smokeless tobacco: Not on file  . Alcohol Use: No    Review of Systems  All other systems reviewed and are negative.     Allergies  Review of patient's allergies indicates no known allergies.  Home Medications   Prior to Admission medications   Medication Sig Start Date End Date Taking? Authorizing Provider  glipiZIDE (GLUCOTROL) 5 MG tablet Take 4 mg by mouth daily before breakfast.   Yes Historical Provider, MD  levothyroxine (SYNTHROID, LEVOTHROID) 50 MCG tablet Take 50 mcg by mouth daily before breakfast.   Yes Historical Provider, MD  metFORMIN (GLUCOPHAGE) 500 MG tablet Take 500 mg by mouth 2 (two) times daily with a meal.   Yes Historical Provider, MD  levofloxacin (LEVAQUIN) 500 MG tablet Take 1 tablet (500 mg total) by mouth daily. 02/14/14   Hoy Morn, MD  naproxen (NAPROSYN) 500 MG tablet Take 1 tablet (500 mg total) by mouth 2 (two) times daily. 02/14/14   Hoy Morn, MD  oxyCODONE-acetaminophen (PERCOCET/ROXICET) 5-325 MG per tablet Take 1 tablet by mouth every 4 (four) hours as needed for severe pain. 02/14/14   Hoy Morn, MD   BP 138/79  Pulse 64  Temp(Src) 97.7 F (36.5  C) (Oral)  Resp 18  Ht 6\' 1"  (1.854 m)  Wt 230 lb (104.327 kg)  BMI 30.35 kg/m2  SpO2 97% Physical Exam  Nursing note and vitals reviewed. Constitutional: He is oriented to person, place, and time. He appears well-developed and well-nourished.  HENT:  Head: Normocephalic and atraumatic.  Eyes: EOM are normal.  Neck: Normal range of motion.  Cardiovascular: Normal rate, regular rhythm, normal heart sounds and intact distal pulses.   Pulmonary/Chest: Effort normal and breath sounds normal. No respiratory distress.  Tenderness of the left lateral chest without obvious deformity.  No bruising noted.  No rash noted.  Abdominal: Soft. He exhibits no distension. There is no tenderness.  Musculoskeletal: Normal range of motion.  Neurological: He is alert and oriented to person, place, and time.  Skin: Skin is warm and dry.  Psychiatric: He has a normal mood and affect. Judgment normal.    ED Course  Procedures (including critical care time) Labs Review Labs Reviewed  CBC - Abnormal; Notable for the following:    RBC 3.91 (*)    Hemoglobin 12.7 (*)    HCT 37.4 (*)    All other components within normal limits  BASIC METABOLIC PANEL - Abnormal; Notable for the following:    Glucose, Bld 139 (*)    GFR calc non  Af Amer 66 (*)    GFR calc Af Amer 76 (*)    All other components within normal limits    Imaging Review Dg Chest 1 View  02/14/2014   CLINICAL DATA:  Repeat frontal view with nipple marker  EXAM: CHEST - 1 VIEW  COMPARISON:  Prior film same day  FINDINGS: Cardiomediastinal silhouette is stable. Bilateral nipple markers are noted. Nodular density left base corresponds to the nipple. There is no lung nodule. Persistent streaky atelectasis or infiltrate left base.  IMPRESSION: No evidence of lung nodule. Nodular density left base corresponds to the nipple. Persistent streaky left basilar atelectasis or infiltrate.   Electronically Signed   By: Lahoma Crocker M.D.   On: 02/14/2014 14:26    Dg Chest 2 View  02/14/2014   CLINICAL DATA:  Pain post fall 2 days ago  EXAM: CHEST  2 VIEW  COMPARISON:  None.  FINDINGS: Cardiomediastinal silhouette is unremarkable. There is streaky left base retrocardiac atelectasis or infiltrate. Best seen on lateral view. Probable nodular nipple shadow left base. Repeat frontal view of the chest with nipple markers recommended for confirmation.  IMPRESSION: There is streaky left base retrocardiac atelectasis or infiltrate. Best seen on lateral view. Probable nodular nipple shadow left base. Repeat frontal view of the chest with nipple markers recommended for confirmation.   Electronically Signed   By: Lahoma Crocker M.D.   On: 02/14/2014 13:50     EKG Interpretation None      MDM   Final diagnoses:  Chest pain  Rib contusion  Fall  Diarrhea    Likely chest wall contusion versus nondisplaced rib fracture.  Patient has atelectasis versus infiltrate.  Patient be treated for possible early infiltrate.  Levaquin now.  Home with pain medicine and incentive spirometry.  I've asked the patient and the patient's wife to return to the ER for new or worsening symptoms including but not limited to shortness of breath, fever, worsening pain despite pain medication.    Hoy Morn, MD 02/14/14 1455

## 2014-02-14 NOTE — Discharge Instructions (Signed)
Chest Contusion A chest contusion is a deep bruise on your chest area. Contusions are the result of an injury that caused bleeding under the skin. A chest contusion may involve bruising of the skin, muscles, or ribs. The contusion may turn blue, purple, or yellow. Minor injuries will give you a painless contusion, but more severe contusions may stay painful and swollen for a few weeks. CAUSES  A contusion is usually caused by a blow, trauma, or direct force to an area of the body. SYMPTOMS   Swelling and redness of the injured area.  Discoloration of the injured area.  Tenderness and soreness of the injured area.  Pain. DIAGNOSIS  The diagnosis can be made by taking a history and performing a physical exam. An X-ray, CT scan, or MRI may be needed to determine if there were any associated injuries, such as broken bones (fractures) or internal injuries. TREATMENT  Often, the best treatment for a chest contusion is resting, icing, and applying cold compresses to the injured area. Deep breathing exercises may be recommended to reduce the risk of pneumonia. Over-the-counter medicines may also be recommended for pain control. HOME CARE INSTRUCTIONS   Put ice on the injured area.  Put ice in a plastic bag.  Place a towel between your skin and the bag.  Leave the ice on for 15-20 minutes, 03-04 times a day.  Only take over-the-counter or prescription medicines as directed by your caregiver. Your caregiver may recommend avoiding anti-inflammatory medicines (aspirin, ibuprofen, and naproxen) for 48 hours because these medicines may increase bruising.  Rest the injured area.  Perform deep-breathing exercises as directed by your caregiver.  Stop smoking if you smoke.  Do not lift objects over 5 pounds (2.3 kg) for 3 days or longer if recommended by your caregiver. SEEK IMMEDIATE MEDICAL CARE IF:   You have increased bruising or swelling.  You have pain that is getting worse.  You have  difficulty breathing.  You have dizziness, weakness, or fainting.  You have blood in your urine or stool.  You cough up or vomit blood.  Your swelling or pain is not relieved with medicines. MAKE SURE YOU:   Understand these instructions.  Will watch your condition.  Will get help right away if you are not doing well or get worse. Document Released: 05/16/2001 Document Revised: 05/15/2012 Document Reviewed: 02/12/2012 [facility] Patient Information 2014 [place].

## 2014-02-14 NOTE — ED Notes (Signed)
[place] over a wheelbarrow on Thursday morning and landed on his left side. Now having pain in left side when he moves

## 2014-02-16 MED ORDER — CEFUROXIME AXETIL 500 MG TAB
500 mg | ORAL_TABLET | Freq: Two times a day (BID) | ORAL | Status: AC
Start: 2014-02-16 — End: 2014-02-23

## 2014-02-16 NOTE — Telephone Encounter (Signed)
Stanley Holt, Stanley Holt  Male, 73 y.o., 06-11-1941  Last Weight: 231 lb (104.781 kg)  Phone: [phone]  PCP: Gloriajean Dell  Language: English  Need Interp: No  AllergiesChlorhexidine  Tape [Adhesive]  Health Maintenance: Due  FYINone  Primary Ins: ATH [employer]I  MRN: [id number]  MyChart: Active  Next Appt: 10/27/2014   Dr.White/Telephone  Received: Today     [place]               Patient's wife, Rod Holler, called and would like Dr. Evette Georges nurse to call her back. Patient fell on 02/12/2014 while out of town and experienced pain in side. He went to ER and had x-rays on 02/14/2014 and no broken bones were noticed but they did diagnose him with pneumonia. Has taken 2 doses of "levofloxacin" but is experience side effects of swollen feet and ankles and has stopped taking medication. Please call patient at [phone] or wife at [phone].              Called pt and booked appt to see dr white tomorrow at 9:45

## 2014-02-16 NOTE — Patient Instructions (Signed)
Leg and Ankle Edema: After Your Visit  Your Care Instructions  Swelling in the legs, ankles, and feet is called edema. It is common after you sit or stand for a while. Long plane flights or car rides often cause swelling in the legs and feet. You may also have swelling if you have to stand for long periods of time at your job. Problems with the veins in the legs (varicose veins) and changes in hormones can also cause swelling. Sometimes the swelling in the ankles and feet is caused by a more serious problem, such as heart failure, infection, blood clots, or liver or kidney disease.  Follow-up care is a key part of your treatment and safety. Be sure to make and go to all appointments, and call your doctor if you are having problems. It???s also a good idea to know your test results and keep a list of the medicines you take.  How can you care for yourself at home?  ?? If your doctor gave you medicine, take it as prescribed. Call your doctor if you think you are having a problem with your medicine.  ?? Whenever you are resting, raise your legs up. Try to keep the swollen area higher than the level of your heart.  ?? Take breaks from standing or sitting in one position.  ?? Walk around to increase the blood flow in your lower legs.  ?? Move your feet and ankles often while you stand, or tighten and relax your leg muscles.  ?? Wear support stockings. Put them on in the morning, before swelling gets worse.  ?? Eat a balanced diet. Lose weight if you need to.  ?? Limit the amount of salt (sodium) in your diet. Salt holds fluid in the body and may increase swelling.  When should you call for help?  Call 911 anytime you think you may need emergency care. For example, call if:  ?? You have symptoms of a blood clot in your lung (called a pulmonary embolism). These may include:  ?? Sudden chest pain.  ?? Trouble breathing.  ?? Coughing up blood.  Call your doctor now or seek immediate medical care if:  ?? You have signs of a blood clot,  such as:  ?? Pain in your calf, back of the knee, thigh, or groin.  ?? Redness and swelling in your leg or groin.  ?? You have symptoms of infection, such as:  ?? Increased pain, swelling, warmth, or redness.  ?? Red streaks or pus.  ?? A fever.  Watch closely for changes in your health, and be sure to contact your doctor if:  ?? Your swelling is getting worse.  ?? You have new or worsening pain in your legs.  ?? You do not get better as expected.   Where can you learn more?   Go to http://www.healthwise.net/[facility]  Enter N696 in the search box to learn more about "Leg and Ankle Edema: After Your Visit."   ?? 2006-2015 [employer]wise[employer]. Care instructions adapted under license by  (which disclaims liability or warranty for this information). This care instruction is for use with your licensed healthcare professional. If you have questions about a medical condition or this instruction, always ask your healthcare professional. Healthwise, Incorporated disclaims any warranty or liability for your use of this information.  Content Version: 10.4.390249; Current as of: July 18, 2013

## 2014-02-16 NOTE — Telephone Encounter (Signed)
Pt wants you to know that he is seeing dr Kellie Simmering today at 2:45

## 2014-02-16 NOTE — Telephone Encounter (Signed)
LMTCB on patient's voicemail.  LMTCB on Stanley Holt's voicemail (she is on HIPAA form).

## 2014-02-16 NOTE — Progress Notes (Signed)
Patient fell on 02-12-14, was seen in ER on 02-14-14 for diarrhea and rib pain. Xray done and showed pneumonia.

## 2014-02-16 NOTE — Progress Notes (Signed)
HISTORY OF PRESENT ILLNESS  Stanley Holt is a 73 y.o. male.  HPI  Fall on 6/11 also with some increasing diarrhea.   Went to ED on 6/13.  Chest X-ray showed evidence of pneumonia.  Prescribed levofloxacin in ED.  Given another on Sunday however the patient has had persistant diarrhea and did not want to continue the levofloxacin.  He reports no shortness of breath or coughing.  He is feeling somewhat better overall.    He has also noted some swelling of his feet bilaterally.  This has come on with the onset of his present illness.  He has not been eating well and does report that he has been trying to drink more and stay hydrated.     Patient Active Problem List   Diagnosis Code   ??? DM w/o Complication Type II, Uncontrolled ([place]) 250.02   ??? 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 Prostate ca ([place]) 185   ??? Head and neck cancer ([facility]) 195.0   ??? Hypothyroid 244.9     Prior to Admission medications    Medication Sig Start Date End Date Taking? Authorizing Provider   cholecalciferol (VITAMIN D3) 1,000 unit tablet Take  by mouth daily.   Yes Historical Provider   levofloxacin (LEVAQUIN) 500 mg tablet Take  by mouth daily.   Yes Historical Provider   naproxen (NAPROSYN) 500 mg tablet Take 500 mg by mouth two (2) times daily (with meals).   Yes Historical Provider   cefUROXime (CEFTIN) 500 mg tablet Take 1 Tab by mouth two (2) times a day for 7 days. 02/16/14 02/23/14 Yes Marcille Buffy, MD   levothyroxine (SYNTHROID) 50 mcg tablet Take  by mouth Daily (before breakfast).   Yes Historical Provider   aspirin delayed-release 81 mg tablet Take 1 Tab by mouth daily.   Yes Gloriajean Dell, MD   glimepiride (AMARYL) 4 mg tablet Take  by mouth two (2) times a day.   Yes Historical Provider   FERROUS FUMARATE/VIT BCOMP&C (SUPER B COMPLEX PO) Take  by mouth.     Yes Historical Provider   glucose blood VI test strips (ASCENSIA AUTODISC VI, ONE TOUCH ULTRA TEST VI) strip 1 Each by Does Not Apply route. Pt testing once  daily. 06/28/10  Yes Gloriajean Dell, MD   metformin ER (GLUCOPHAGE XR) 500 mg tablet Take 500 mg by mouth two (2) times a day. 2 qpm  Indications: TYPE 2 DIABETES MELLITUS   Yes Historical Provider   ascorbic acid (VITAMIN C) 1,000 mg tablet Take  by mouth.   Yes Historical Provider   Saliva Stimulant Agents Comb.3 (BIOTENE MOISTURIZING MOUTH) Spry 1 Spray by Mucous Membrane route once as needed. 12/02/09  Yes Historical Provider        Review of Systems   Constitutional: Negative.    HENT: Negative.    Respiratory: Negative for cough and shortness of breath.    Cardiovascular: Negative for chest pain and palpitations.   Gastrointestinal: Positive for diarrhea. Negative for nausea, vomiting and abdominal pain.   Musculoskeletal: Negative.    All other systems reviewed and are negative.    BP 140/86 mmHg   Pulse 84   Temp(Src) 98.2 ??F (36.8 ??C) (Oral)   Resp 20   Ht 6' (1.829 m)   Wt 231 lb (104.781 kg)   BMI 31.32 kg/m2   SpO2 94%    Physical Exam   Constitutional: He is oriented to person, place, and time.  He appears well-developed and well-nourished.   HENT:   Head: Normocephalic.   Mouth/Throat: Oropharynx is clear and moist. No oropharyngeal exudate.   Eyes: Pupils are equal, round, and reactive to light.   Neck: Normal range of motion.   Cardiovascular: Normal rate, regular rhythm and normal heart sounds.    No murmur heard.  Pulmonary/Chest: Effort normal and breath sounds normal. He has no wheezes.   Lymphadenopathy:     He has no cervical adenopathy.   Neurological: He is alert and oriented to person, place, and time.   Skin: Skin is warm and dry. No rash noted.       ASSESSMENT and PLAN    ICD-9-CM    1. Right lower lobe pneumonia- Given diarrhea, will change antibiotics to cover for pneumonia.  486 cefUROXime (CEFTIN) 500 mg tablet   2. Edema of foot- Unclear etiology but I suspect that he has been taking in free water with limited food intake and likely has some third spacing.  Follow up lab work. 782.3  CBC WITH AUTOMATED DIFF     METABOLIC PANEL, COMPREHENSIVE   The patient agrees with and understands the plan of care. All questions have been answered.   Follow-up Disposition:  Return if symptoms worsen or fail to improve.

## 2014-02-17 LAB — METABOLIC PANEL, COMPREHENSIVE
A-G Ratio: 1.8 (ref 1.1–2.5)
ALT (SGPT): 22 IU/L (ref 0–44)
AST (SGOT): 25 IU/L (ref 0–40)
Albumin: 4.5 g/dL (ref 3.5–4.8)
Alk. phosphatase: 41 IU/L (ref 39–117)
BUN/Creatinine ratio: 17 (ref 10–22)
BUN: 18 mg/dL (ref 8–27)
Bilirubin, total: 0.3 mg/dL (ref 0.0–1.2)
CO2: 25 mmol/L (ref 18–29)
Calcium: 9.4 mg/dL (ref 8.6–10.2)
Chloride: 101 mmol/L (ref 97–108)
Creatinine: 1.09 mg/dL (ref 0.76–1.27)
GFR est AA: 79 mL/min/{1.73_m2} (ref >59)
GFR est non-AA: 68 mL/min/{1.73_m2} (ref >59)
GLOBULIN, TOTAL: 2.5 g/dL (ref 1.5–4.5)
Glucose: 124 mg/dL — ABNORMAL HIGH (ref 65–99)
Potassium: 4.2 mmol/L (ref 3.5–5.2)
Protein, total: 7 g/dL (ref 6.0–8.5)
Sodium: 143 mmol/L (ref 134–144)

## 2014-02-17 LAB — CBC WITH AUTOMATED DIFF
ABS. BASOPHILS: 0 10*3/uL (ref 0.0–0.2)
ABS. EOSINOPHILS: 0.1 10*3/uL (ref 0.0–0.4)
ABS. IMM. GRANS.: 0 10*3/uL (ref 0.0–0.1)
ABS. MONOCYTES: 0.5 10*3/uL (ref 0.1–0.9)
ABS. NEUTROPHILS: 5.8 10*3/uL (ref 1.4–7.0)
Abs Lymphocytes: 1 10*3/uL (ref 0.7–3.1)
BASOPHILS: 0 %
EOSINOPHILS: 2 %
HCT: 34.7 % — ABNORMAL LOW (ref 37.5–51.0)
HGB: 11.8 g/dL — ABNORMAL LOW (ref 12.6–17.7)
IMMATURE GRANULOCYTES: 0 %
Lymphocytes: 14 %
MCH: 31.1 pg (ref 26.6–33.0)
MCHC: 34 g/dL (ref 31.5–35.7)
MCV: 92 fL (ref 79–97)
MONOCYTES: 7 %
NEUTROPHILS: 77 %
PLATELET: 190 10*3/uL (ref 150–379)
RBC: 3.79 x10E6/uL — ABNORMAL LOW (ref 4.14–5.80)
RDW: 14.7 % (ref 12.3–15.4)
WBC: 7.4 10*3/uL (ref 3.4–10.8)

## 2014-02-24 NOTE — Progress Notes (Signed)
Follow up to pneumonia. Improving slowly.

## 2014-02-24 NOTE — Progress Notes (Signed)
HISTORY OF PRESENT ILLNESS  Stanley Holt is a 73 y.o. male.  HPI He is seen today in follow up for pneumonia.   Seen at [place] ER for diarrhea and some dehydration and had fallen. He was diagnosed with pneumonia 02/14/14 and was treated with levaquin but had swelling of legs which he thinks was a reaction to levaquin.  He was seen by Dr Kellie Simmering 02/16/14 as was not tolerating levaquin and levaquin stopped and was treated with ceftin x 10 days. He denies SOB and cough and is felling better.  He fell tripping over lawn mower 02/12/14 and since then has had some soreness in left side of chest. He is taking aleve prn. Tolerating well.  Rib pain has improved.  His diarrhea has resolved.     Review of Systems   Constitutional: Negative for fever and chills.   HENT: Negative for congestion and sore throat.    Respiratory: Negative for cough, sputum production, shortness of breath and wheezing.        Physical Exam   Constitutional: He appears well-developed. No distress.   BP 112/68 mmHg   Pulse 80   Temp(Src) 97.5 ??F (36.4 ??C) (Oral)   Resp 20   Ht 6' (1.829 m)   Wt 228 lb 12.8 oz (103.783 kg)   BMI 31.02 kg/m2   SpO2 95%   Cardiovascular: Normal rate, regular rhythm, normal heart sounds and intact distal pulses.    No murmur heard.  Pulmonary/Chest: Effort normal and breath sounds normal. He has no wheezes.   Left lower rib tender to palpation. No ecchymosis    Abdominal: Soft. Bowel sounds are normal. There is no tenderness.   Musculoskeletal: He exhibits no edema.   Psychiatric: He has a normal mood and affect.   Vitals reviewed.      ASSESSMENT and PLAN    ICD-9-CM    1.      2. Right lower lobe pneumonia  Resolved clinically  Recheck chest xray at 4 weeks.   Left rib pain  Second to falling and is much improve  Tylenol prn, aleve prn 486    I have discussed the diagnosis with the patient and the intended plan as seen in the above orders.  The patient has received an after-visit summary  and questions were answered concerning future plans.  I have discussed medication side effects and warnings with the patient as well.  He has expressed understanding of the diagnosis and plan    Follow-up Disposition:  Return if symptoms worsen or fail to improve, for follow up.

## 2014-03-25 NOTE — Progress Notes (Signed)
Quick Note:        Advised pt per Dr.Hayes' note regarding recent CXR. Patient verbalized understanding.    ______

## 2014-03-25 NOTE — Progress Notes (Signed)
Quick Note:        Notify pt chest xray shows no pneumonia.    ______

## 2014-06-17 LAB — AMB EXT LDL-C: LDL-C, External: 67

## 2014-07-01 LAB — AMB EXT PSA: PSA, External: 0.2

## 2014-09-18 LAB — AMB EXT URINE MICROALBUMIN: Urine Microalbumin, External: 130.9

## 2014-09-18 LAB — AMB EXT CREATININE: Creatinine, External: 1.1

## 2014-09-18 LAB — AMB EXT HGBA1C: Hemoglobin A1c, External: 6.9

## 2014-10-27 ENCOUNTER — Ambulatory Visit: Admit: 2014-10-27 | Discharge: 2014-10-27 | Payer: MEDICARE | Attending: Internal Medicine | Primary: Internal Medicine

## 2014-10-27 DIAGNOSIS — Z0000 Encounter for general adult medical examination without abnormal findings: Secondary | ICD-10-CM

## 2014-10-27 MED ORDER — PNEUMOCOCCAL 13-VAL CONJ VACCINE-DIP CRM (PF) 0.5 ML IM SYRINGE
0.5 mL | Freq: Once | INTRAMUSCULAR | Status: AC
Start: 2014-10-27 — End: 2014-10-27

## 2014-10-27 NOTE — Progress Notes (Signed)
Stanley Holt is a 74 y.o. male and presents for Annual Medicare Wellness Visit.    Assessment of cognitive impairment: Alert and oriented x 3.      Abuse Screen:    Abuse Screening Questionnaire 10/27/2014   Do you ever feel afraid of your partner? N   Are you in a relationship with someone who physically or mentally threatens you? N   Is it safe for you to go home? Y       Depression Screen:   PHQ 2 / 9, over the last two weeks 10/27/2014   Little interest or pleasure in doing things Not at all   Feeling down, depressed or hopeless Not at all   Total Score PHQ 2 0       Fall Risk Assessment:    Fall Risk Assessment, last 12 mths 10/27/2014   Able to walk? Yes   Fall in past 12 months? Yes   Fall with injury? No   Number of falls in past 12 months 2   Fall Risk Score 2       Activities of Daily Living:    ADL Assessment 10/27/2014   Feeding yourself No Help Needed   Getting from bed to chair No Help Needed   Getting dressed No Help Needed   Bathing or showering No Help Needed   Walk across the room (includes cane/walker) No Help Needed   Using the telphone No Help Needed   Taking your medications No Help Needed   Preparing meals No Help Needed   Managing money (expenses/bills) No Help Needed   Moderately strenuous housework (laundry) No Help Needed   Shopping for personal items (toiletries/medicines) No Help Needed   Shopping for groceries No Help Needed   Driving No Help Needed   Climbing a flight of stairs No Help Needed   Getting to places beyond walking distances No Help Needed       Health Maintenance:  Daily Low Dose Aspirin: yes, 81mg   Bone Density: NA  Glaucoma Screening: yes, 04/28/14  Immunizations:    Tetanus: up to date, 08/23/12  Influenza: up to date, 06/02/14.  Shingles:  up to date, 06/18/14. Pneumovax:  up to date, 10/22/12.  Prevnar: order placed.  Cancer screening:    Cervical: NA.  Breast: NA.  Colon: up to date, 10/08/07 - DUE 12yrs.  Prostate:  managed by Urology, PSA 0.226 on 02/03/14     Advance Care Planning:   End of Life Planning: has NO advanced directive - not interested in additional information.  Provided pt with "Respecting Choices packet of Information" no  Offered facilitator session with NN no     Medications/Allergies: Reviewed with patient  Prior to Admission medications    Medication Sig Start Date End Date Taking? Authorizing Provider   lisinopril (PRINIVIL, ZESTRIL) 5 mg tablet Take  by mouth daily.   Yes Historical Provider   cholecalciferol (VITAMIN D3) 1,000 unit tablet Take  by mouth daily.   Yes Historical Provider   levothyroxine (SYNTHROID) 50 mcg tablet Take  by mouth Daily (before breakfast).   Yes Historical Provider   aspirin delayed-release 81 mg tablet Take 1 Tab by mouth daily.   Yes Gloriajean Dell, MD   glimepiride (AMARYL) 4 mg tablet Take  by mouth two (2) times a day.   Yes Historical Provider   FERROUS FUMARATE/VIT BCOMP&C (SUPER B COMPLEX PO) Take  by mouth.     Yes Historical Provider   glucose blood VI test  strips (ASCENSIA AUTODISC VI, ONE TOUCH ULTRA TEST VI) strip 1 Each by Does Not Apply route. Pt testing once daily. 06/28/10  Yes Gloriajean Dell, MD   metformin ER (GLUCOPHAGE XR) 500 mg tablet Take 500 mg by mouth two (2) times a day. 2 qpm  Indications: TYPE 2 DIABETES MELLITUS   Yes Historical Provider   ascorbic acid (VITAMIN C) 1,000 mg tablet Take  by mouth.   Yes Historical Provider   Saliva Stimulant Agents Comb.3 (BIOTENE MOISTURIZING MOUTH) Spry 1 Spray by Mucous Membrane route once as needed. 12/02/09  Yes Historical Provider     Allergies   Allergen Reactions   ??? Chlorhexidine Contact Dermatitis   ??? Levofloxacin Swelling   ??? Tape [Adhesive] Contact Dermatitis     Clear surgical tape over port.       PSH: Reviewed with patient  Past Surgical History   Procedure Laterality Date   ??? Hx hernia repair       bilateral   ??? Hx orthopaedic  06/2007     lumbar laminectomy   ??? Hx orthopaedic       right shoulder surgery   ??? Endoscopy, colon, diagnostic           SH: Reviewed with patient  History   Substance Use Topics   ??? Smoking status: Former Smoker -- 40 years     Quit date: 09/04/2002   ??? Smokeless tobacco: Never Used   ??? Alcohol Use: 0.5 oz/week     1 Cans of beer per week       FH: Reviewed with patient  Family History   Problem Relation Age of Onset   ??? Cancer Mother      breast   ??? Diabetes Brother    ??? Arthritis-rheumatoid Father    ??? Arthritis-rheumatoid Sister          Objective:  BP 112/74 mmHg   Pulse 80   Temp(Src) 97.7 ??F (36.5 ??C) (Oral)   Resp 16   Ht 6' (1.829 m)   Wt 228 lb (103.42 kg)   BMI 30.92 kg/m2   SpO2 96% Body mass index is 30.92 kg/(m^2).      Alcohol Risk Screen:   On any occasion during past 3 months, have you had more than 3 drinks (male) or 4 drinks (male) containing alcohol?  No  Do you average more than 7 drinks (male) or 14 drinks (male) per week?  No  Type and Amount: rarely    Tobacco Abuse:  No    Nutrition Screen:  eats a balanced diet    Hearing Loss:  normal-to-mild.  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   Requires assistance with: no ADLs  Patient handle his/her own medications  yes Use of pill box  yes    Current medical providers:    Patient Care Team:  Gloriajean Dell, MD as PCP - General  Jaci Carrel, MD (Hematology and Oncology)  Pearlie Oyster, MD (Urology)  Matilde Haymaker, MD (Otolaryngology)  Becky Augusta, MD (Internal Medicine)  Geronimo Running  Clydene Pugh, RN as Nurse Navigator (Internal Medicine)      Plan:      Orders Placed This Encounter   ??? lisinopril (PRINIVIL, ZESTRIL) 5 mg tablet       Health Maintenance  Topic Date Due   ??? HEMOGLOBIN A1C Q6M  02/24/2014   ??? MICROALBUMIN Q1  02/25/2014   ??? LIPID PANEL Q1  05/28/2014   ??? INFLUENZA AGE 27 TO ADULT  04/05/2015   ??? EYE EXAM RETINAL OR DILATED Q1  04/29/2015   ??? MEDICARE YEARLY EXAM  10/28/2015   ??? FOOT EXAM Q1  10/28/2015    ??? GLAUCOMA SCREENING Q2Y  04/28/2016   ??? COLONOSCOPY  09/04/2017   ??? Td Q 10 Yrs Age > 18  08/23/2022   ??? PNEUMOCOCCAL ADULT PPSV  Completed   ??? ZOSTER VACCINE AGE 39>  Completed   ??? Tdap Age > 18  Completed       *Patient verbalized understanding and agreement with the plan.  A copy of the After Visit Summary with personalized health plan was given to the patient today.  Physical Exam will be performed by PCP and documented under a separate Progress Note.

## 2014-10-27 NOTE — Progress Notes (Signed)
HPI:  Stanley Holt is a 74 y.o. year old male who returns to clinic today for follow up: AODM, proteinuria, hypothyroid.   History of head and neck cancer and is in remission and followed by Dr Guadalupe Maple oncology. History of prostate cancer and is followed by urology. He is also here for medicare wellness.   1. Cardiovascular: He reports taking medications as instructed, no medication side effects noted, The home BP readings outside of office: not check. .  Diet and Lifestyle: generally follows a low fat low cholesterol diet, generally follows a low sodium diet. Exercise: walking sporadically due to weather.   2. AODM with proteinuria;  Since last visit: followed by endocrine and last hgba1c 6.9.  Home glucose monitoring: 90-150.   He reports medication compliance: compliant all of the time.  He reports the following medication side effects: none.  Diabetic diet compliance: compliant most of the time.    3. Hypothyroid: Thyroid ROS: denies fatigue, weight changes, heat/cold intolerance, bowel/skin changes or CVS symptoms.   Current Outpatient Prescriptions   Medication Sig Dispense Refill   ??? lisinopril (PRINIVIL, ZESTRIL) 5 mg tablet Take  by mouth daily.     ??? cholecalciferol (VITAMIN D3) 1,000 unit tablet Take  by mouth daily.     ??? levothyroxine (SYNTHROID) 50 mcg tablet Take  by mouth Daily (before breakfast).     ??? aspirin delayed-release 81 mg tablet Take 1 Tab by mouth daily. 30 Tab 11   ??? glimepiride (AMARYL) 4 mg tablet Take  by mouth two (2) times a day.     ??? FERROUS FUMARATE/VIT BCOMP&C (SUPER B COMPLEX PO) Take  by mouth.       ??? glucose blood VI test strips (ASCENSIA AUTODISC VI, ONE TOUCH ULTRA TEST VI) strip 1 Each by Does Not Apply route. Pt testing once daily. 150 Strip 3   ??? metformin ER (GLUCOPHAGE XR) 500 mg tablet Take 500 mg by mouth two (2) times a day. 2 qpm  Indications: TYPE 2 DIABETES MELLITUS     ??? ascorbic acid (VITAMIN C) 1,000 mg tablet Take  by mouth.      ??? Saliva Stimulant Agents Comb.3 (BIOTENE MOISTURIZING MOUTH) Spry 1 Spray by Mucous Membrane route once as needed.       Allergies   Allergen Reactions   ??? Chlorhexidine Contact Dermatitis   ??? Levofloxacin Swelling   ??? Tape [Adhesive] Contact Dermatitis     Clear surgical tape over port.     History   Substance Use Topics   ??? Smoking status: Former Smoker -- 40 years     Quit date: 09/04/2002   ??? Smokeless tobacco: Never Used   ??? Alcohol Use: 0.5 oz/week     1 Cans of beer per week         Review of Systems   Constitutional: Negative for fever, weight loss and malaise/fatigue.   HENT: Negative for congestion and sore throat.    Respiratory: Negative for cough and shortness of breath.    Cardiovascular: Negative for chest pain and leg swelling.   Gastrointestinal: Negative for heartburn, nausea, vomiting, abdominal pain, diarrhea, constipation and blood in stool.   Genitourinary: Positive for urgency. Negative for dysuria and frequency.   Musculoskeletal: Positive for joint pain (right knee and hands on and off).   Neurological: Positive for dizziness (on and off vertigo for years. see ENT.). Negative for sensory change, focal weakness and headaches.   Psychiatric/Behavioral: Negative for depression. The patient is not nervous/anxious  and does not have insomnia.        Physical Exam   Constitutional: He appears well-developed. No distress.   BP 112/74 mmHg   Pulse 80   Temp(Src) 97.7 ??F (36.5 ??C) (Oral)   Resp 16   Ht 6' (1.829 m)   Wt 228 lb (103.42 kg)   BMI 30.92 kg/m2   SpO2 96%   HENT:   Head: Normocephalic.   Nose: Nose normal.   Mouth/Throat: Oropharynx is clear and moist.   Eyes: Pupils are equal, round, and reactive to light.   Neck: Carotid bruit is not present. No thyromegaly present.   Cardiovascular: Normal rate, regular rhythm, normal heart sounds and intact distal pulses.    No murmur heard.  Pulmonary/Chest: Effort normal and breath sounds normal. He has no wheezes.    Abdominal: Soft. Bowel sounds are normal. There is no tenderness.   Musculoskeletal: He exhibits no edema.   Psychiatric: He has a normal mood and affect.   Vitals reviewed.  Monofilament intact bilaterally. Pulses 2+ bilaterally and left 2nd hammer toe. Skin dry. no open wounds.         Assessment & Plan:    ICD-10-CM ICD-9-CM    1. Medicare annual wellness visit, subsequent  Health maintenance reviewed and updated with patient today at visit.  Reviewed nurse navigator note.   Z00.00 V70.0    2. Encounter for immunization Z23 V03.89    3. Alcohol screening Z13.89 V79.1    4. Depression screening Z13.89 V79.0    5. Advanced care planning/counseling discussion Z71.89 V65.49    6. Diabetes mellitus type 2, controlled ([place])  Well controlled, continue current medication.  E11.9 250.00 LIPID PANEL   7. Hypothyroidism due to acquired atrophy of thyroid  Continue current medication.  E03.8 244.8     E03.4 246.8    8. 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 Primary osteoarthritis of right knee  Tylenol prn M17.11 715.16    10. Medication monitoring encounter Z51.81 V58.83 CBC WITH AUTOMATED DIFF      Orders Placed This Encounter   ??? LIPID PANEL   ??? CBC WITH AUTOMATED DIFF   ??? AMB EXT CREATININE   ??? AMB EXT HGBA1C   ??? AMB EXT URINE MICROALBUMIN   ??? AMB EXT PSA   ??? AMB EXT LDL-C   ??? lisinopril (PRINIVIL, ZESTRIL) 5 mg tablet   ??? pneumococcal 13 val conj dip (PREVNAR-13) 0.5 mL syrg injection        Follow-up Disposition:  Return in about 6 months (around 04/27/2015) for follow up.   Advised him to call back or return to office if symptoms worsen/change/persist.  Discussed expected course/resolution/complications of diagnosis in detail with patient.    Medication risks/benefits/costs/interactions/alternatives discussed with patient.  He was given an after visit summary which includes diagnoses, current medications, & vitals.  He expressed understanding with the diagnosis and plan.

## 2014-10-27 NOTE — Progress Notes (Signed)
CPE. Received shingles shot, will call for date.  Sees Dr. Demetria Pore and Dr. Ace Gins.

## 2014-11-03 NOTE — Telephone Encounter (Signed)
From: Karsten Fells  To: Gloriajean Dell, MD  Sent: 11/03/2014 10:38 AM EST  Subject:  Non-Urgent Medical Question    Urine  Microalbumin, External 130.9     Any  recommendations?

## 2014-11-09 ENCOUNTER — Inpatient Hospital Stay: Admit: 2014-12-04 | Payer: MEDICARE | Primary: Internal Medicine

## 2014-11-09 DIAGNOSIS — E119 Type 2 diabetes mellitus without complications: Secondary | ICD-10-CM

## 2014-11-10 LAB — CBC WITH AUTOMATED DIFF
ABS. BASOPHILS: 0 10*3/uL (ref 0.0–0.2)
ABS. EOSINOPHILS: 0.1 10*3/uL (ref 0.0–0.4)
ABS. IMM. GRANS.: 0 10*3/uL (ref 0.0–0.1)
ABS. MONOCYTES: 0.4 10*3/uL (ref 0.1–0.9)
ABS. NEUTROPHILS: 2.9 10*3/uL (ref 1.4–7.0)
Abs Lymphocytes: 1.1 10*3/uL (ref 0.7–3.1)
BASOPHILS: 0 %
EOSINOPHILS: 3 %
HCT: 38.8 % (ref 37.5–51.0)
HGB: 13.4 g/dL (ref 12.6–17.7)
IMMATURE GRANULOCYTES: 0 %
Lymphocytes: 24 %
MCH: 31.7 pg (ref 26.6–33.0)
MCHC: 34.5 g/dL (ref 31.5–35.7)
MCV: 92 fL (ref 79–97)
MONOCYTES: 9 %
NEUTROPHILS: 64 %
PLATELET: 159 10*3/uL (ref 150–379)
RBC: 4.23 x10E6/uL (ref 4.14–5.80)
RDW: 14.7 % (ref 12.3–15.4)
WBC: 4.6 10*3/uL (ref 3.4–10.8)

## 2014-11-10 LAB — LIPID PANEL
Cholesterol, total: 130 mg/dL (ref 100–199)
HDL Cholesterol: 37 mg/dL — ABNORMAL LOW (ref >39)
LDL, calculated: 72 mg/dL (ref 0–99)
Triglyceride: 104 mg/dL (ref 0–149)
VLDL, calculated: 21 mg/dL (ref 5–40)

## 2014-11-10 NOTE — Progress Notes (Signed)
Quick Note:        Results released to patient via MyChart. All labs are stable or at goal for him.    ______

## 2015-03-16 LAB — AMB EXT PSA: PSA, External: 0.146

## 2015-04-27 ENCOUNTER — Ambulatory Visit: Admit: 2015-04-27 | Discharge: 2015-04-27 | Payer: MEDICARE | Attending: Internal Medicine | Primary: Internal Medicine

## 2015-04-27 DIAGNOSIS — E1129 Type 2 diabetes mellitus with other diabetic kidney complication: Secondary | ICD-10-CM

## 2015-04-27 NOTE — Progress Notes (Signed)
HPI:  Stanley Holt is a 74 y.o. year old male who returns to clinic today for follow up:  hypothyroid, AODM, hyperlipidemia and history of prostate cancer.    1. Cardiovascular: He reports taking medications as instructed, no medication side effects noted, The home BP readings outside of office: not check. . Diet and Lifestyle: generally follows a low fat low cholesterol diet, generally follows a low sodium diet. Exercise: [place].    2. AODM with proteinuria;  Since last visit: reviewed last labs and is followed by endocrine and last hgba1c 6.9. Home glucose monitoring: 80-130. He reports medication compliance: compliant all of the time. He reports the following medication side effects: none. Diabetic diet compliance: compliant most of the time.   3. Hypothyroid: Thyroid ROS: denies fatigue, weight changes, heat/cold intolerance, bowel/skin changes or CVS symptoms.   4. History of prostate cancer and followed by urology.       Current Outpatient Prescriptions   Medication Sig Dispense Refill   ??? lisinopril (PRINIVIL, ZESTRIL) 5 mg tablet Take  by mouth daily.     ??? cholecalciferol (VITAMIN D3) 1,000 unit tablet Take  by mouth daily.     ??? levothyroxine (SYNTHROID) 50 mcg tablet Take  by mouth Daily (before breakfast).     ??? aspirin delayed-release 81 mg tablet Take 1 Tab by mouth daily. 30 Tab 11   ??? glimepiride (AMARYL) 4 mg tablet Take  by mouth two (2) times a day.     ??? FERROUS FUMARATE/VIT BCOMP&C (SUPER B COMPLEX PO) Take  by mouth.       ??? glucose blood VI test strips (ASCENSIA AUTODISC VI, ONE TOUCH ULTRA TEST VI) strip 1 Each by Does Not Apply route. Pt testing once daily. 150 Strip 3   ??? metformin ER (GLUCOPHAGE XR) 500 mg tablet Take 500 mg by mouth two (2) times a day. 2 qpm  Indications: TYPE 2 DIABETES MELLITUS     ??? ascorbic acid (VITAMIN C) 1,000 mg tablet Take  by mouth.     ??? Saliva Stimulant Agents Comb.3 (BIOTENE MOISTURIZING MOUTH) Spry 1 Spray  by Mucous Membrane route once as needed.       Allergies   Allergen Reactions   ??? Chlorhexidine Contact Dermatitis   ??? Levofloxacin Swelling   ??? Tape [Adhesive] Contact Dermatitis     Clear surgical tape over port.     Social History   Substance Use Topics   ??? Smoking status: Former Smoker     Years: 40.00     Quit date: 09/04/2002   ??? Smokeless tobacco: Never Used   ??? Alcohol use 0.5 oz/week     1 Cans of beer per week         Review of Systems   Constitutional: Negative for malaise/fatigue.   Respiratory: Negative for shortness of breath.    Cardiovascular: Negative for chest pain and leg swelling.   Gastrointestinal: Negative for abdominal pain and heartburn.   Neurological: Negative for dizziness and headaches.       Physical Exam   Constitutional: He appears well-developed. No distress.   BP 102/76  Pulse 80  Temp 98.1 ??F (36.7 ??C) (Oral)   Resp 16  Ht 6' (1.829 m)  Wt 227 lb (103 kg)  SpO2 97%  BMI 30.79 kg/m2   Neck: Carotid bruit is not present. No thyromegaly present.   Cardiovascular: Normal rate, regular rhythm, normal heart sounds and intact distal pulses.    No murmur heard.  Pulmonary/Chest: Effort normal and breath sounds normal. He has no wheezes.   Abdominal: Soft. Bowel sounds are normal. There is no tenderness.   Musculoskeletal: He exhibits no edema.   Psychiatric: He has a normal mood and affect.   Vitals reviewed.        Assessment & Plan:    ICD-10-CM ICD-9-CM    1. 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 Well controlled, continue current medication.   Continue lisinopril E11.29 250.40     R80.9 791.0    2. CRI (chronic renal insufficiency), stage 2 (mild)    N18.2 585.2    3. Hypothyroidism due to acquired atrophy of thyroid  Well controlled, continue current medication.  E03.8 244.8     E03.4 246.8    4. Colon cancer screening Z12.11 V76.51 OCCULT BLOOD, STOOL   5. History of prostate cancer   Z85.46 V10.46    6. History of head and neck cancer   Followed by Dr Guadalupe Maple and no recurrence.  Z85.89 V10.89         Follow-up Disposition:  Return in about 6 months (around 10/28/2015) for follow up.   Advised him to call back or return to office if symptoms worsen/change/persist.  Discussed expected course/resolution/complications of diagnosis in detail with patient.    Medication risks/benefits/costs/interactions/alternatives discussed with patient.  He was given an after visit summary which includes diagnoses, current medications, & vitals.  He expressed understanding with the diagnosis and plan.

## 2015-04-27 NOTE — Progress Notes (Signed)
6 month follow up. Sees Dr. Ace Gins for diabetes and thyroid. Had labs done in June. Saw urologist, Dr. Janifer Adie, in June.

## 2015-07-02 IMAGING — CR DG CHEST 2V
2 series · 2 of 2 positions shown · non-contrast
Comparison: None.

CLINICAL DATA: Pain post fall 2 days ago

EXAM:
CHEST  2 VIEW

[w chest pa]
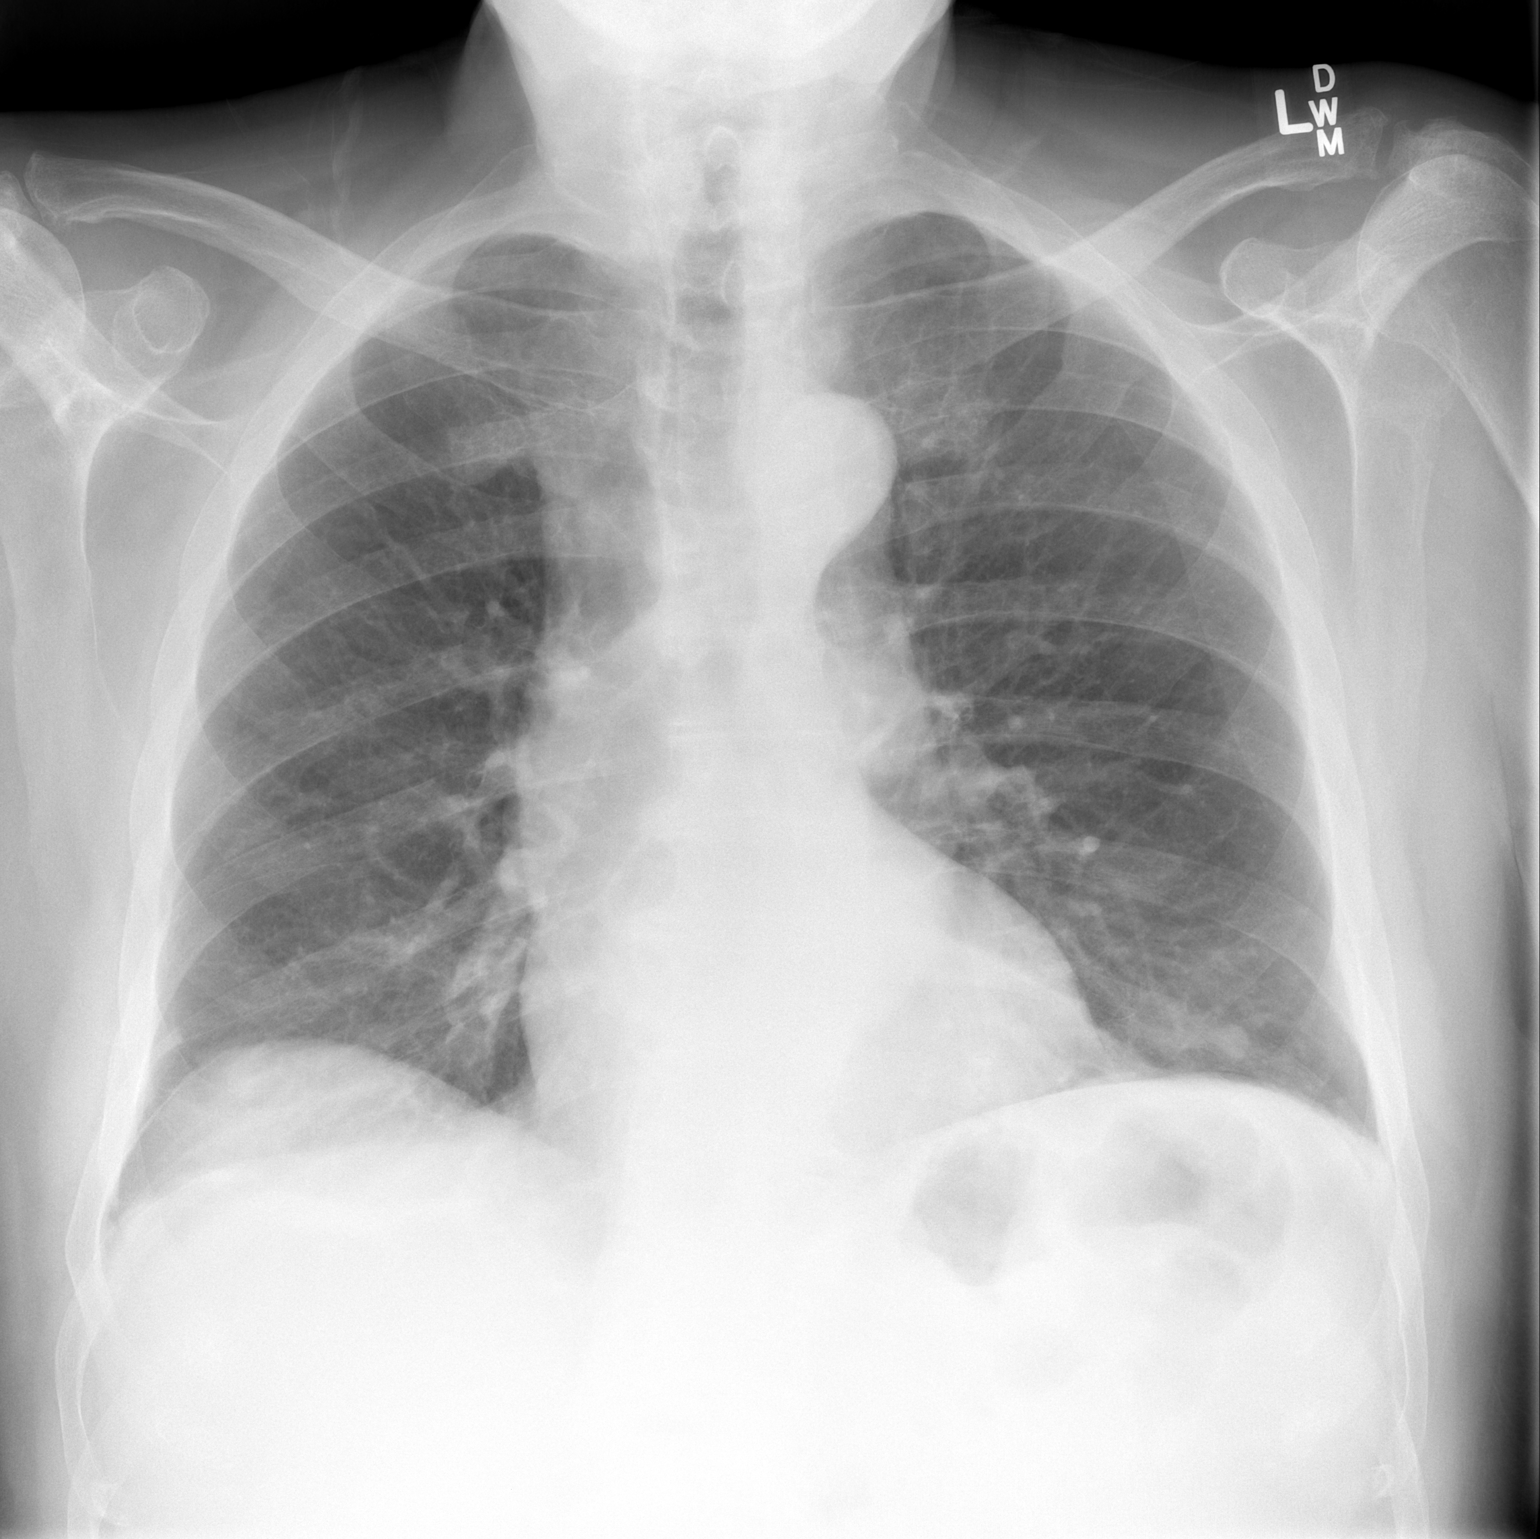

[w chest lat]
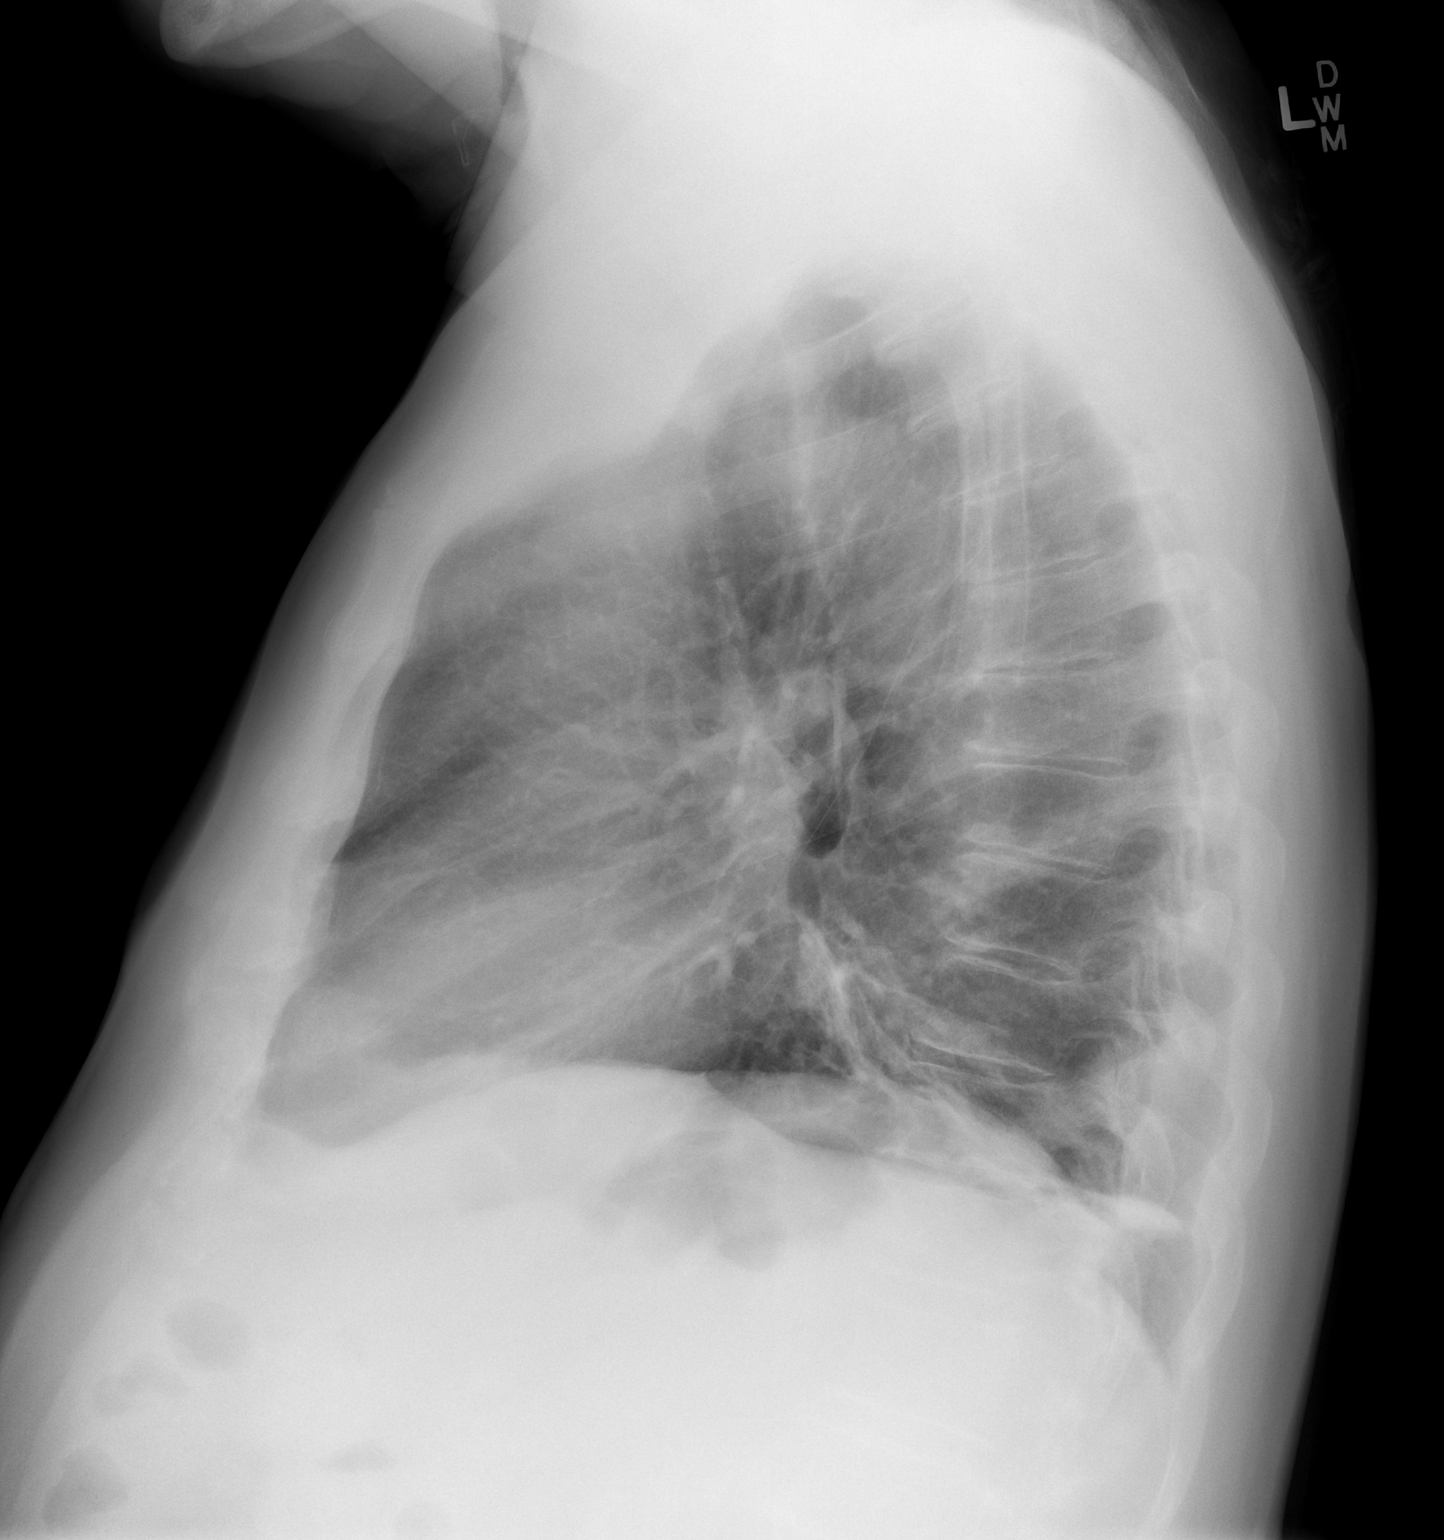

[2 of 2 positions shown; findings below may reference images not displayed]

FINDINGS: Cardiomediastinal silhouette is unremarkable. There is streaky left
base retrocardiac atelectasis or infiltrate. Best seen on lateral
view. Probable nodular nipple shadow left base. Repeat frontal view
of the chest with nipple markers recommended for confirmation.
IMPRESSION: There is streaky left base retrocardiac atelectasis or infiltrate.
Best seen on lateral view. Probable nodular nipple shadow left base.
Repeat frontal view of the chest with nipple markers recommended for
confirmation.

## 2015-07-02 IMAGING — CR DG CHEST 1V
1 series · 1 of 1 positions shown · non-contrast
Comparison: Prior film same day

CLINICAL DATA: Repeat frontal view with nipple marker

EXAM:
CHEST - 1 VIEW

[w chest pa]
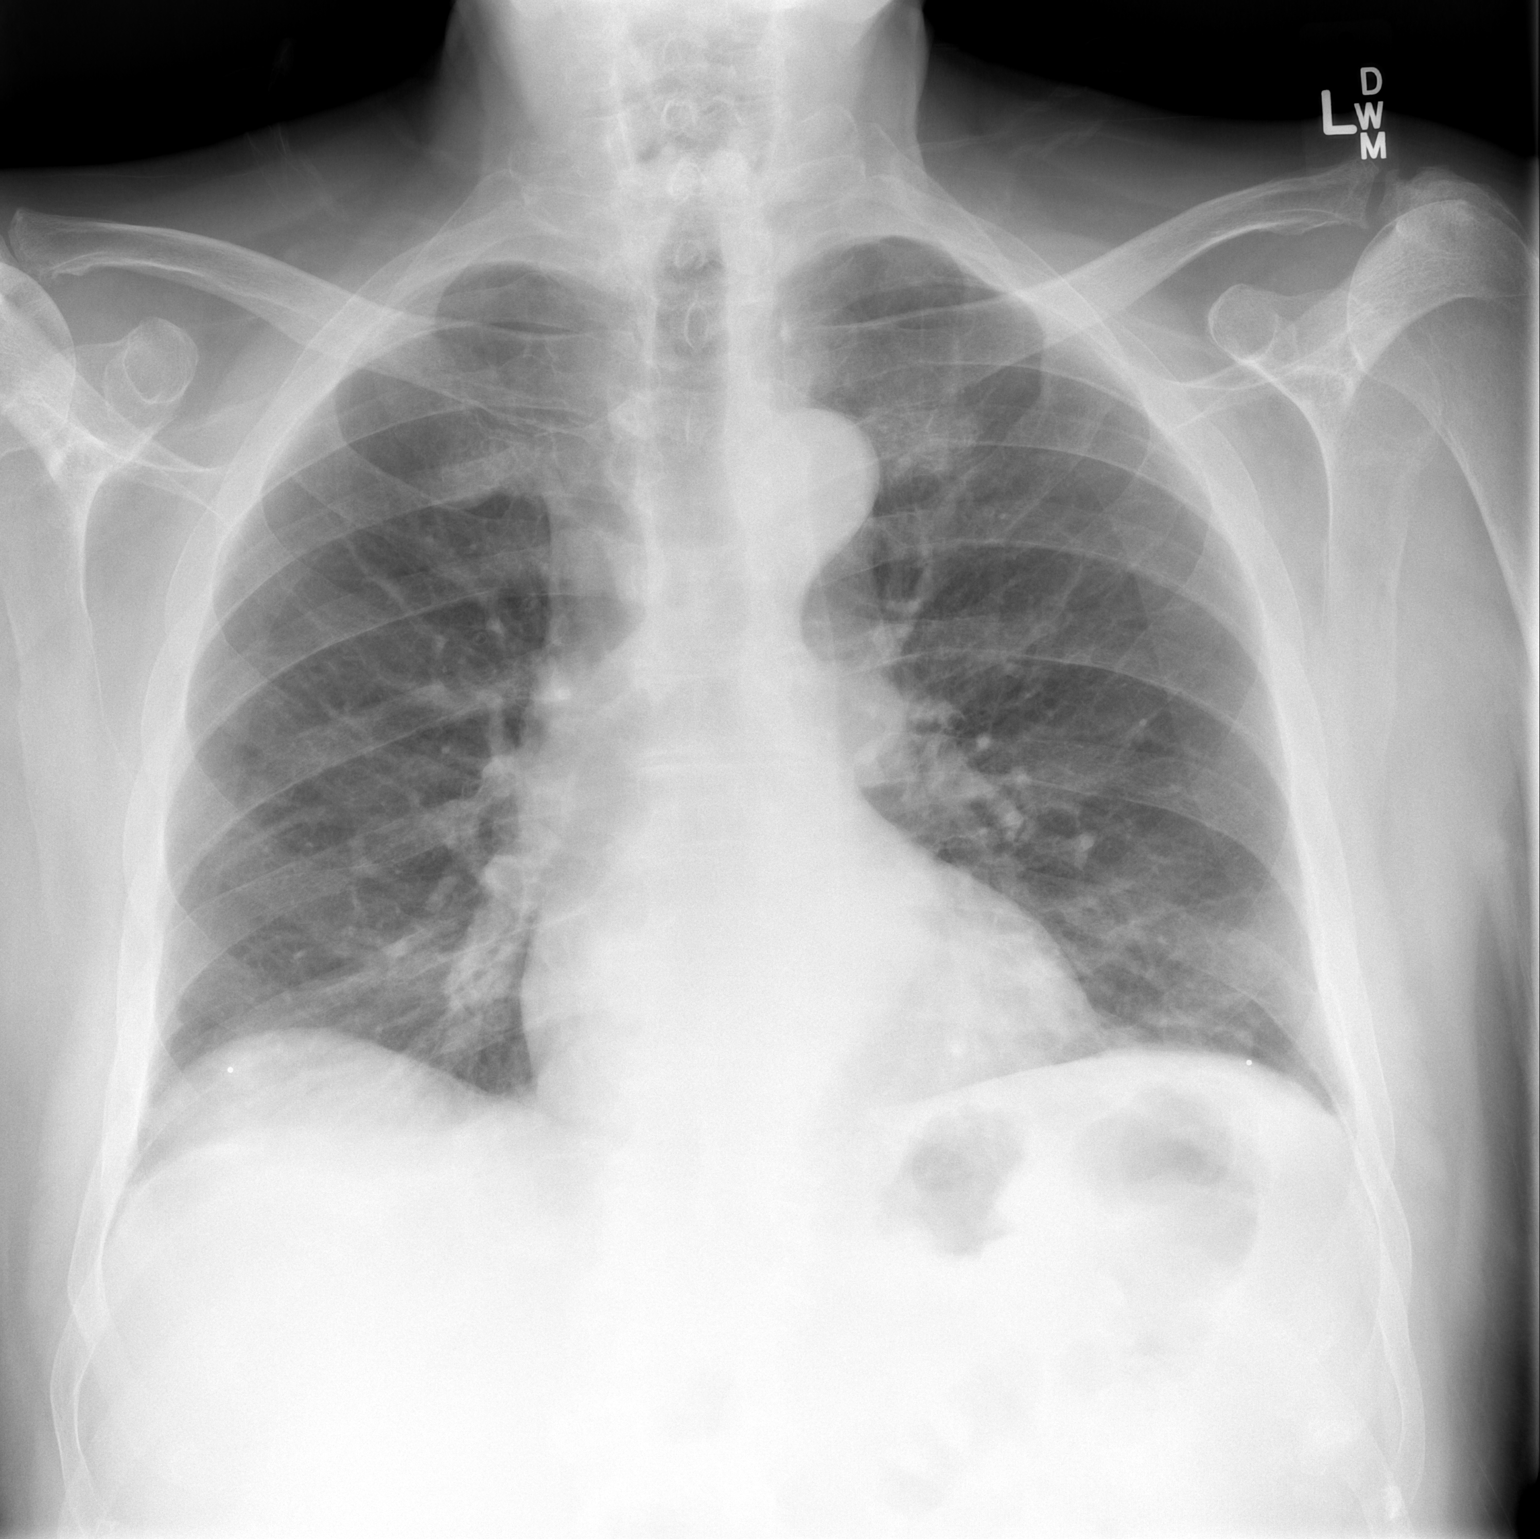

[1 of 1 positions shown; findings below may reference images not displayed]

FINDINGS: Cardiomediastinal silhouette is stable. Bilateral nipple markers are
noted. Nodular density left base corresponds to the nipple. There is
no lung nodule. Persistent streaky atelectasis or infiltrate left
base.
IMPRESSION: No evidence of lung nodule. Nodular density left base corresponds to
the nipple. Persistent streaky left basilar atelectasis or
infiltrate.

## 2015-10-29 ENCOUNTER — Ambulatory Visit: Admit: 2015-10-29 | Discharge: 2015-10-29 | Payer: MEDICARE | Attending: Internal Medicine | Primary: Internal Medicine

## 2015-10-29 DIAGNOSIS — E1129 Type 2 diabetes mellitus with other diabetic kidney complication: Secondary | ICD-10-CM

## 2015-10-29 LAB — AMB POC HEMOGLOBIN A1C: Hemoglobin A1c (POC): 7 %

## 2015-10-29 NOTE — Patient Instructions (Signed)
As discussed in your appointment today, Advance Care Planning is an important part of planning for your healthcare future. Discussing your preferences with your family and your care team is a part of good healthcare so that we can be guided by your known values and goals. Our office offers this service at no cost to you. Our Nurse Navigators and certified Respecting Choices ?? Facilitators, Claire and Joyce typically schedule family appointments for this service on Wednesdays. To schedule an Advance Care Planning visit or to receive more information about this service, please call [facility] at [phone] and ask to speak directly to Claire Ballard or Joyce Rusincovitch

## 2015-10-29 NOTE — ACP (Advance Care Planning) (Signed)
End of life planning discussed with patient.  Patient states that they do not have an advance care plan at this time.  Information has been provided in today's after visit summary with directions on how to contact our office to schedule an appointment with our nurse navigators for this service.  Patient verbalized understanding.

## 2015-10-29 NOTE — Progress Notes (Signed)
Chief Complaint   Patient presents with   ??? Hypertension

## 2015-10-29 NOTE — Progress Notes (Signed)
HPI:  Stanley Holt is a 75 y.o. year old male who returns to clinic today for follow up: hypothyroid, AODM, hyperlipidemia and history of prostate cancer.   1. Cardiovascular: He reports taking medications as instructed, no medication side effects noted, The home BP readings outside of office: not checking. . Diet and Lifestyle: generally follows a low fat low cholesterol diet, generally follows a low sodium diet. Exercise: walking     2. AODM with microalbuminuria;  Since last visit: reviewed last labs and is followed by Dr Ace Gins endocrine and last hgba1c 7.0. Home glucose monitoring: 120-160. He reports medication compliance: compliant all of the time. He reports the following medication side effects: none. Diabetic diet compliance: compliant most of the time.   3. Hypothyroid: Thyroid ROS: denies fatigue, weight changes, heat/cold intolerance, bowel/skin changes or CVS symptoms.   4. History of prostate cancer and followed by urology.     Current Outpatient Prescriptions   Medication Sig Dispense Refill   ??? lisinopril (PRINIVIL, ZESTRIL) 5 mg tablet Take  by mouth daily.     ??? cholecalciferol (VITAMIN D3) 1,000 unit tablet Take  by mouth daily.     ??? levothyroxine (SYNTHROID) 50 mcg tablet Take  by mouth Daily (before breakfast).     ??? aspirin delayed-release 81 mg tablet Take 1 Tab by mouth daily. 30 Tab 11   ??? glimepiride (AMARYL) 4 mg tablet Take  by mouth two (2) times a day.     ??? FERROUS FUMARATE/VIT BCOMP&C (SUPER B COMPLEX PO) Take  by mouth.       ??? glucose blood VI test strips (ASCENSIA AUTODISC VI, ONE TOUCH ULTRA TEST VI) strip 1 Each by Does Not Apply route. Pt testing once daily. 150 Strip 3   ??? metformin ER (GLUCOPHAGE XR) 500 mg tablet Take 500 mg by mouth two (2) times a day. 2 qpm  Indications: TYPE 2 DIABETES MELLITUS     ??? ascorbic acid (VITAMIN C) 1,000 mg tablet Take  by mouth.     ??? Saliva Stimulant Agents Comb.3 (BIOTENE MOISTURIZING MOUTH) Spry 1 Spray  by Mucous Membrane route once as needed.       Allergies   Allergen Reactions   ??? Chlorhexidine Contact Dermatitis   ??? Levofloxacin Swelling   ??? Tape [Adhesive] Contact Dermatitis     Clear surgical tape over port.     Social History   Substance Use Topics   ??? Smoking status: Former Smoker     Years: 40.00     Quit date: 09/04/2002   ??? Smokeless tobacco: Never Used   ??? Alcohol use 0.5 oz/week     1 Cans of beer per week         Review of Systems   Constitutional: Negative for malaise/fatigue.   Respiratory: Negative for shortness of breath.    Cardiovascular: Negative for chest pain and leg swelling.   Gastrointestinal: Negative for abdominal pain and heartburn.   Neurological: Negative for dizziness and headaches.       Physical Exam   Constitutional: He appears well-developed. No distress.   BP 120/78 (BP 1 Location: Right arm, BP Patient Position: Sitting)   Pulse 72   Temp 98.2 ??F (36.8 ??C) (Oral)    Resp 16   Ht 6' 1.5" (1.867 m)   Wt 233 lb 3.2 oz (105.8 kg)   SpO2 95%   BMI 30.35 kg/m2   Neck: Carotid bruit is not present. No thyromegaly present.   Cardiovascular: Normal rate,  regular rhythm, normal heart sounds and intact distal pulses.    No murmur heard.  Pulmonary/Chest: Effort normal and breath sounds normal. He has no wheezes.   Abdominal: Soft. Bowel sounds are normal. There is no tenderness.   Musculoskeletal: He exhibits no edema.   Psychiatric: He has a normal mood and affect.   Vitals reviewed.        Assessment & Plan:    ICD-10-CM ICD-9-CM    1. Controlled type 2 diabetes mellitus with microalbuminuria, without long-term current use of insulin (HCC)  a1c 7.0. Continue to work on diabetic diet and Continue current medication. Followed also by endocrine.   Reviewed last labs from endocrine E11.29 250.40     R80.9 791.0    2. Acquired hypothyroidism  Well controlled, continue current medication.  E03.9 244.9    3. Hammer toes of both feet  Refer to podiatry  M20.41 735.4     M20.42      4. Microalbuminuria R80.9 791.0    5. Osteoarthritis, unspecified osteoarthritis type, unspecified site M19.90 715.90    6. Screening for alcoholism Z13.89 V79.1    7. Medicare annual wellness visit, subsequent  Health maintenance reviewed and updated with patient today at visit.   Z00.00 V70.0    8. Prostate CA ([place]) hx  Followed by urology [id number]       Orders Placed This Encounter   ??? AMB POC HEMOGLOBIN A1C      Follow-up Disposition:  Return in about 6 months (around 04/27/2016) for follow up.   Advised him to call back or return to office if symptoms worsen/change/persist.  Discussed expected course/resolution/complications of diagnosis in detail with patient.    Medication risks/benefits/costs/interactions/alternatives discussed with patient.  He was given an after visit summary which includes diagnoses, current medications, & vitals.  He expressed understanding with the diagnosis and plan.      This is also a Subsequent Medicare Annual Wellness Visit providing Personalized Prevention Plan Services (PPPS) (Performed 12 months after initial AWV and PPPS )    I have reviewed the patient's medical history in detail and updated the computerized patient record.     History     Past Medical History:   Diagnosis Date   ??? Cancer ([place]) 03/2009    throat cancer.   ??? Diabetes ([place])    ??? DM w/o Complication Type II, Uncontrolled 06/23/2008   ??? Hypothyroid 10/23/2013   ??? Microalbuminuria    ??? Microalbuminuria    ??? Osteoarthritis 02/03/2009   ??? Prostate CA ([place]) 10/19/2011   ??? Unspecified essential hypertension 06/23/2008      Past Surgical History:   Procedure Laterality Date   ??? ENDOSCOPY, COLON, DIAGNOSTIC     ??? HX HERNIA REPAIR      bilateral   ??? HX ORTHOPAEDIC  06/2007    lumbar laminectomy   ??? HX ORTHOPAEDIC      right shoulder surgery     Current Outpatient Prescriptions   Medication Sig Dispense Refill   ??? lisinopril (PRINIVIL, ZESTRIL) 5 mg tablet Take  by mouth daily.      ??? cholecalciferol (VITAMIN D3) 1,000 unit tablet Take  by mouth daily.     ??? levothyroxine (SYNTHROID) 50 mcg tablet Take  by mouth Daily (before breakfast).     ??? aspirin delayed-release 81 mg tablet Take 1 Tab by mouth daily. 30 Tab 11   ??? glimepiride (AMARYL) 4 mg tablet Take  by mouth two (2) times a day.     ???  FERROUS FUMARATE/VIT BCOMP&C (SUPER B COMPLEX PO) Take  by mouth.       ??? glucose blood VI test strips (ASCENSIA AUTODISC VI, ONE TOUCH ULTRA TEST VI) strip 1 Each by Does Not Apply route. Pt testing once daily. 150 Strip 3   ??? metformin ER (GLUCOPHAGE XR) 500 mg tablet Take 500 mg by mouth two (2) times a day. 2 qpm  Indications: TYPE 2 DIABETES MELLITUS     ??? ascorbic acid (VITAMIN C) 1,000 mg tablet Take  by mouth.     ??? Saliva Stimulant Agents Comb.3 (BIOTENE MOISTURIZING MOUTH) Spry 1 Spray by Mucous Membrane route once as needed.       Allergies   Allergen Reactions   ??? Chlorhexidine Contact Dermatitis   ??? Levofloxacin Swelling   ??? Tape [Adhesive] Contact Dermatitis     Clear surgical tape over port.     Family History   Problem Relation Age of Onset   ??? Cancer Mother      breast   ??? Diabetes Brother    ??? Arthritis-rheumatoid Father    ??? Arthritis-rheumatoid Sister      Social History   Substance Use Topics   ??? Smoking status: Former Smoker     Years: 40.00     Quit date: 09/04/2002   ??? Smokeless tobacco: Never Used   ??? Alcohol use 0.5 oz/week     1 Cans of beer per week     Patient Active Problem List   Diagnosis Code   ??? Diabetes mellitus type 2, controlled ([place]) E11.9   ??? 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 Prostate CA ([place]) C61   ??? Head and neck cancer (HCC) C76.0   ??? Hypothyroid E03.9   ??? Advanced care planning/counseling discussion Z71.89       Depression Risk Factor Screening:     PHQ 2 / 9, over the last two weeks 10/29/2015   Little interest or pleasure in doing things Not at all   Feeling down, depressed or hopeless Not at all   Total Score PHQ 2 0      Alcohol Risk Factor Screening:   On any occasion during the past 3 months, have you had more than 4 drinks containing alcohol?  No    Do you average more than 14 drinks per week?  No      Functional Ability and Level of Safety:     Hearing Loss   none    Activities of Daily Living   Self-care.   Requires assistance with: no ADLs    Fall Risk     Fall Risk Assessment, last 12 mths 10/29/2015   Able to walk? Yes   Fall in past 12 months? No   Fall with injury? -   Number of falls in past 12 months -   Fall Risk Score -     Abuse Screen   Patient is not abused    Review of Systems   As above    Physical Examination     Evaluation of Cognitive Function:  Mood/affect:  happy  Appearance: age appropriate  Family member/caregiver input: none  As above    Patient Care Team:  Gloriajean Dell, MD as PCP - General  Jaci Carrel, MD (Hematology and Oncology)  Pearlie Oyster, MD (Urology)  Matilde Haymaker, MD (Otolaryngology)  Becky Augusta, MD (Internal Medicine)  Geronimo Running  Clydene Pugh, RN as Nurse Navigator (Internal Medicine)  Advice/Referrals/Counseling   Education and counseling provided:  Are appropriate based on today's review and evaluation      Assessment/Plan   As above reviewed ACP.

## 2015-12-29 LAB — AMB EXT URINE MICROALBUMIN: Urine Microalbumin, External: 207

## 2015-12-30 LAB — AMB EXT URINE MICROALBUMIN: Urine Microalbumin, External: 129.4

## 2016-04-27 ENCOUNTER — Encounter: Attending: Internal Medicine | Primary: Internal Medicine

## 2016-04-28 LAB — AMB EXT LDL-C: LDL-C, External: 65

## 2016-04-28 LAB — AMB EXT HGBA1C: Hemoglobin A1c, External: 6.7

## 2016-04-29 LAB — AMB EXT CREATININE: Creatinine, External: 1.15

## 2016-04-29 LAB — AMB EXT LDL-C: LDL-C, External: 65

## 2016-04-29 LAB — AMB EXT HGBA1C: Hemoglobin A1c, External: 6.7

## 2016-05-03 ENCOUNTER — Ambulatory Visit: Admit: 2016-05-03 | Discharge: 2016-05-03 | Payer: MEDICARE | Attending: Internal Medicine | Primary: Internal Medicine

## 2016-05-03 DIAGNOSIS — E1129 Type 2 diabetes mellitus with other diabetic kidney complication: Secondary | ICD-10-CM

## 2016-05-03 NOTE — Progress Notes (Signed)
HPI:  Stanley Holt is a 75 y.o. year old male who returns to clinic today for follow up: hypothyroid, AODM, hyperlipidemia and history of prostate cancer.   1. Cardiovascular: He reports taking medications as instructed, no medication side effects noted, The home BP readings outside of office: 120-130/60-80. Diet and Lifestyle: generally follows a low fat low cholesterol diet, generally follows a low sodium diet. Exercise: walking    2. AODM with microalbuminuria;  Since last visit: reviewed last labs and is followed by Dr Ace Gins endocrine and last hgba1c 6.7. Home glucose monitoring: am 150-180. And pm 90-130. He reports medication compliance: compliant all of the time. He reports the following medication side effects: none. Diabetic diet compliance: compliant most of the time.   3. Hypothyroid: followed by Dr Ace Gins. Who just increased dose of synthroid to 75 mcg every day. Thyroid ROS: denies fatigue, weight changes, heat/cold intolerance, bowel/skin changes or CVS symptoms.   4. History of prostate cancer and followed by urology.       Current Outpatient Prescriptions   Medication Sig Dispense Refill   ??? lisinopril (PRINIVIL, ZESTRIL) 5 mg tablet Take  by mouth daily.     ??? cholecalciferol (VITAMIN D3) 1,000 unit tablet Take  by mouth daily.     ??? levothyroxine (SYNTHROID) 50 mcg tablet Take 75 mcg by mouth Daily (before breakfast).     ??? aspirin delayed-release 81 mg tablet Take 1 Tab by mouth daily. 30 Tab 11   ??? glimepiride (AMARYL) 4 mg tablet Take  by mouth two (2) times a day.     ??? FERROUS FUMARATE/VIT BCOMP&C (SUPER B COMPLEX PO) Take  by mouth.       ??? glucose blood VI test strips (ASCENSIA AUTODISC VI, ONE TOUCH ULTRA TEST VI) strip 1 Each by Does Not Apply route. Pt testing once daily. 150 Strip 3   ??? metformin ER (GLUCOPHAGE XR) 500 mg tablet Take 500 mg by mouth two (2) times a day. 2 qpm  Indications: TYPE 2 DIABETES MELLITUS     ??? ascorbic acid (VITAMIN C) 1,000 mg tablet Take  by mouth.      ??? Saliva Stimulant Agents Comb.3 (BIOTENE MOISTURIZING MOUTH) Spry 1 Spray by Mucous Membrane route once as needed.       Allergies   Allergen Reactions   ??? Chlorhexidine Contact Dermatitis   ??? Levofloxacin Swelling   ??? Tape [Adhesive] Contact Dermatitis     Clear surgical tape over port.     Social History   Substance Use Topics   ??? Smoking status: Former Smoker     Years: 40.00     Quit date: 09/04/2002   ??? Smokeless tobacco: Never Used   ??? Alcohol use 0.5 oz/week     1 Cans of beer per week         Review of Systems   Constitutional: Negative for malaise/fatigue.   Respiratory: Negative for shortness of breath.    Cardiovascular: Negative for chest pain and leg swelling.   Gastrointestinal: Negative for abdominal pain and heartburn.   Neurological: Negative for dizziness and headaches.       Physical Exam   Constitutional: He appears well-developed. No distress.   BP 120/70   Pulse 74   Temp 98 ??F (36.7 ??C) (Oral)    Resp 16   Ht 6' 1.5" (1.867 m)   Wt 229 lb 12.8 oz (104.2 kg)   SpO2 97%   BMI 29.91 kg/m2   Neck: Carotid bruit is  not present. No thyromegaly present.   Cardiovascular: Normal rate, regular rhythm, normal heart sounds and intact distal pulses.    No murmur heard.  Pulmonary/Chest: Effort normal and breath sounds normal. He has no wheezes.   Abdominal: Soft. Bowel sounds are normal. There is no tenderness.   Musculoskeletal: He exhibits no edema.   Psychiatric: He has a normal mood and affect.   Vitals reviewed.        Assessment & Plan:    ICD-10-CM ICD-9-CM    1. Controlled type 2 diabetes mellitus with microalbuminuria, without long-term current use of insulin (HCC)Well controlled, continue current medication. At goal.  E11.29 250.40     R80.9 791.0    2. Acquired hypothyroidism  Recent dose change by endocrine. Clinically euthyroid  E03.9 244.9    3. Head and neck cancer ([place])  No evidence of recurrence and followed by oncology yearly. Last note reviewed.  C76.0 195.0          Follow-up Disposition:  Return in about 6 months (around 11/01/2016) for follow up.   Advised him to call back or return to office if symptoms worsen/change/persist.  Discussed expected course/resolution/complications of diagnosis in detail with patient.    Medication risks/benefits/costs/interactions/alternatives discussed with patient.  He was given an after visit summary which includes diagnoses, current medications, & vitals.  He expressed understanding with the diagnosis and plan.

## 2016-05-12 NOTE — Telephone Encounter (Signed)
Patient Appointment Date: 9/19      Stanley Holt, 75 y.o., male  Is calling for their Mohs Pre-Op Assessment    does not have Hepatitis C or HIV   does confirm site   does not ID site.       Allergies:  Allergies   Allergen Reactions   ??? Chlorhexidine Contact Dermatitis   ??? Levofloxacin Swelling   ??? Tape [Adhesive] Contact Dermatitis     Clear surgical tape over port.         does not have a Pacemaker  does not have a Defibrillator    does not need antibiotics      is not taking NSAIDs      is taking aspirin  If yes, is taking because of preventative    is not taking garlic  is not taking ginkgo  is not taking Ginseng  is not taking fish oils  is not taking vit E    does not take a blood thinner    is not taking Coumadin/Warfarin       Pre operative assessment questions asked to patient.  Patient has a general understanding of the procedure, and has been versed that there will be local anesthesia used in the procedure and that He will be ok to drive themselves to and from the appointment.

## 2016-05-23 ENCOUNTER — Ambulatory Visit: Attending: Dermatology | Primary: Internal Medicine

## 2016-05-23 ENCOUNTER — Ambulatory Visit: Admit: 2016-05-23 | Discharge: 2016-05-23 | Payer: MEDICARE | Attending: Dermatology | Primary: Internal Medicine

## 2016-05-23 DIAGNOSIS — C44329 Squamous cell carcinoma of skin of other parts of face: Secondary | ICD-10-CM

## 2016-05-23 NOTE — Progress Notes (Signed)
This note is written by Mick Sell, as dictated by Hart Carwin. Tristyn Demarest, MD.    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 male referred by Dr. Johney Maine. Stanley Holt has a biopsy-proven squamous cell carcinoma in situ in a pre-existing seborrheic keratosis on the left lateral cheek. This is a new squamous cell carcinoma in situ present for a few months described as a mole-like lesion with no prior treatment. Biopsy confirmed the diagnosis of squamous cell carcinoma at least in situ, and I reviewed the written pathology report. Stanley Holt is feeling well and in his usual state of health today. Stanley Holt has no pain, no current illnesses, no other skin concerns. His allergies, medications, medical, and social history are reviewed by me today.    Exam:     Stanley Holt is an awake, alert, and oriented 75 y.o. male who appears well and in no distress. There is no preauricular, submandibular, or cervical lymphadenopathy. I examined his face. Stanley Holt has an 8 x 6 mm pink scar on his left lateral cheek. Stanley Holt confirms location.    Assessment/plan:    1. Squamous cell carcinoma in situ, left lateral cheek. I discussed the diagnosis of squamous cell carcinoma in situ and summarized the pathology report. Mohs Holt is indicated by site, size, and histology. The procedure was discussed, verbal and written consent were obtained. I performed the procedure. One stage was required to reach a tumor free plane. The surgical defect was managed with intermediate repair. There were no complications. Stanley Holt will follow up as needed as the site heals.    Indications, risks, and options were discussed with Stanley Holt preoperatively. Risks including, but not limited to: pain, bleeding, infection, tumor recurrence, scarring and damage to motor and/or sensory nerves, were discussed. Stanley Holt. Stanley Holt was an acceptable Holt candidate.     Stanley Holt was placed in the appropriate position on the operating table in the Mohs Holt procedure room. The area was prepped and draped in the standard manner. Gentian violet was used to outline the clinical margins of the tumor. Local anesthesia was then obtained.     The grossly visible tumor was then removed, an underlying layer was excised and mapped according to the Mohs technique, and the individual specimens examined microscopically. The process was repeated until microscopic examination of the tissue specimens confirmed a tumor-free plane.     Hemostasis was obtained with electrosurgery and pressure. The wound was covered between stages with moist saline gauze.     The wound management options of second intent healing, layered closure, local flap, and/or full thickness skin graft were discussed. Stanley Holt understands the aims, risks, alternatives, and possible complications and elects to proceed with an intermediate layered closure.    Wound margins were made vertical, edges undermined in the subcutaneous plane, standing cones corrected at both poles followed by layered closure. The wound was closed with buried 6-0 polysorb suture in the subcutis to reduce tension on the skin edges, and skin edges were approximated with 6-0 fast gut suture in the dermis to reduce tension on the epidermis. The final closure length was 22 mm.     The wound was bandaged with Vaseline, Telfa, gauze and Coverroll. Wound care instructions (written and verbal) and a follow up appointment were given to [facility] before discharge. Stanley Holt was discharged in good condition.  2. History of non melanoma skin cancer. I discussed the diagnosis and recommend routine examinations with Dr. Johney Maine for surveillance.    The documentation recorded by the scribe accurately reflects the service I personally performed and the decisions made by me.     [facility]   OFFICE PROCEDURE PROGRESS NOTE      Chart reviewed for the following:     I, Hart Carwin. Nelva Bush, MD, have reviewed the History, Physical and updated the Allergic reactions for Stanley Holt performed immediately prior to start of procedure:     I, Hart Carwin. Nelva Bush, MD, have performed the following reviews on Stanley Holt prior to the start of the procedure:     * Patient was identified by name and date of birth   * Agreement on procedure being performed was verified   * Risks and Benefits explained to the patient   * Procedure site verified and marked as necessary   * Patient was positioned for comfort   * Consent was signed and verified     Time: 9:30 AM  Date of procedure: 05/23/2016  Procedure performed by: Hart Carwin. Nelva Bush, MD   Provider assisted by: LPN   Patient assisted by: self   How tolerated by patient: tolerated the procedure well with no complications   Comments: none

## 2016-05-23 NOTE — Progress Notes (Signed)
Pre-op: Patient presents today for the evaluation of SCC to the Left lateral cheek. Procedure explained with full understanding.   Vitals:    05/23/16 0909   BP: 118/74   Pulse: 74   Resp: 16   Temp: 98 ??F (36.7 ??C)   SpO2: 94%     preoperatively, will continue to monitor.         Post-op: Written and verbal post-op wound care instructions given to patient with full understanding of care. Surgical wound bandaged with Vaseline, Telfa, 2x2 gauze, and coverall tape. All questions and concerns addressed. Vitals stable postoperatively.

## 2016-05-23 NOTE — Patient Instructions (Signed)
WOUND CARE INSTRUCTIONS    1. Keep the dressing clean and dry and do not remove for 48 hours.    2. Then change the dressing once a day as follows:  a. Wash hands before and after each dressing change.  b. Remove dressing and wash site gently with mild soap and water, rinse, and pat dry.  c. Apply an ointment (Bacitracin, Polysporin, Neosporin, Petroleum jelly or Aquaphor).  d. Apply a non-stick (Telfa) dressing or Band-Aid to cover the wound.    Remove pressure bandage on Thursday, then wash gently and apply Vaseline and a band-aid to site daily for 1 week.      3. Watch for:  BLEEDING: A small amount of drainage may occur. If bleeding occurs, elevate and rest the surgery site. Apply gauze and steady pressure for 15 minutes. If bleeding continues, call this office.    INFECTION: Signs of infection include increased redness, pain, warmth, drainage of pus, and fever. If this occurs, call this office.    4. Special Instructions (follow any that are checked):  ??  You have stitches that DO NOT need to be removed.  ??  Avoid bending at the waist and heavy lifting for two days.  ??  Sleep with your head elevated for the next two nights.  ??  Rest the surgery site and keep it elevated as much as possible for two days.  ??  You may apply an ice-pack for 10-15 minutes every waking hour for the rest of the day.  ??  Eat a soft diet and avoid hot food and hot drinks for the rest of the day.  ??  Other instructions:     Follow up as directed.    Take Tylenol for pain as needed.      Once the site is healed with no remaining bandages or open areas, protect your surgical site and scar from the sun, as this area will be more sensitive.  Use a broad spectrum sunscreen SPF 30 or higher daily, and a chemical free product (one containing zinc oxide or titanium dioxide) is a good choice if the area is sensitive.    You may begin to gently massage the surgical site in 2-3 weeks, rubbing in  a circular motion along the scar. This can help reduce swelling and thickness of a scar. A scar cream may be used beginnning 1 month after the surgery.     If you have any questions or concerns, please call our office Monday through Friday at [phone].

## 2016-05-29 NOTE — Telephone Encounter (Signed)
Called patient regarding MOHs post op. Patient states he is healing well and denies any pain, bleeding, or swelling at the site. Pt denies any questions or concerns at this time.

## 2016-08-15 ENCOUNTER — Ambulatory Visit: Admit: 2016-08-15 | Payer: MEDICARE | Attending: Internal Medicine | Primary: Internal Medicine

## 2016-08-15 DIAGNOSIS — L02414 Cutaneous abscess of left upper limb: Secondary | ICD-10-CM

## 2016-08-15 MED ORDER — TRIMETHOPRIM-SULFAMETHOXAZOLE 160 MG-800 MG TAB
160-800 mg | ORAL_TABLET | Freq: Two times a day (BID) | ORAL | 0 refills | Status: AC
Start: 2016-08-15 — End: 2016-08-25

## 2016-08-15 NOTE — Progress Notes (Signed)
HPI:  Stanley Holt is a 75 y.o. year old male who returns to clinic today for an acute visit:   Notes tender cyst for the past week and has had some drainage over the past 24 hours.  He does not feel there is any improvement.  Area is very tender.  He is not taking any medications for this.  States he has a history of infected cysts in the past.    Current Outpatient Prescriptions   Medication Sig Dispense Refill   ??? lisinopril (PRINIVIL, ZESTRIL) 5 mg tablet Take  by mouth daily.     ??? cholecalciferol (VITAMIN D3) 1,000 unit tablet Take  by mouth daily.     ??? levothyroxine (SYNTHROID) 50 mcg tablet Take 75 mcg by mouth Daily (before breakfast).     ??? aspirin delayed-release 81 mg tablet Take 1 Tab by mouth daily. 30 Tab 11   ??? glimepiride (AMARYL) 4 mg tablet Take  by mouth two (2) times a day.     ??? FERROUS FUMARATE/VIT BCOMP&C (SUPER B COMPLEX PO) Take  by mouth.       ??? glucose blood VI test strips (ASCENSIA AUTODISC VI, ONE TOUCH ULTRA TEST VI) strip 1 Each by Does Not Apply route. Pt testing once daily. 150 Strip 3   ??? metformin ER (GLUCOPHAGE XR) 500 mg tablet Take 500 mg by mouth two (2) times a day. 2 qpm  Indications: TYPE 2 DIABETES MELLITUS     ??? ascorbic acid (VITAMIN C) 1,000 mg tablet Take  by mouth.     ??? Saliva Stimulant Agents Comb.3 (BIOTENE MOISTURIZING MOUTH) Spry 1 Spray by Mucous Membrane route once as needed.       Allergies   Allergen Reactions   ??? Chlorhexidine Contact Dermatitis   ??? Levofloxacin Swelling   ??? Tape [Adhesive] Contact Dermatitis     Clear surgical tape over port.     Social History   Substance Use Topics   ??? Smoking status: Former Smoker     Years: 40.00     Quit date: 09/04/2002   ??? Smokeless tobacco: Never Used   ??? Alcohol use 0.5 oz/week     1 Cans of beer per week         Review of Systems   Constitutional: Negative for chills and fever.         Physical Exam   Constitutional: He appears well-developed. No distress.    BP 120/70   Pulse 70   Temp 98.2 ??F (36.8 ??C) (Oral)    Resp 16   Ht 6' 1.5" (1.867 m)   Wt 230 lb (104.3 kg)   SpO2 98%   BMI 29.93 kg/m2             Assessment & Plan:    ICD-10-CM ICD-9-CM    1. Abscess of arm, left which is draining  About 2 cc of pus are expressed from abscess.  Area is redressed. L02.414 682.3    plan: counseled wound care. Probiotic daily.  Bactrim twice daily ??10 days and reviewed potential side effects and medication interactions.  Orders Placed This Encounter   ??? trimethoprim-sulfamethoxazole (BACTRIM DS, SEPTRA DS) 160-800 mg per tablet          Follow-up Disposition:  Return if symptoms worsen or fail to improve.   Advised him to call back or return to office if symptoms worsen/change/persist.  Discussed expected course/resolution/complications of diagnosis in detail with patient.    Medication risks/benefits/costs/interactions/alternatives discussed with patient.  He was given an after visit summary which includes diagnoses, current medications, & vitals.  He expressed understanding with the diagnosis and plan.

## 2016-08-15 NOTE — Progress Notes (Signed)
Chief Complaint   Patient presents with   ??? Cyst     left shoulder     1. Have you been to the ER, urgent care clinic since your last visit?  Hospitalized since your last visit?No    2. Have you seen or consulted any other health care providers outside of the [facility] since your last visit?  Include any pap smears or colon screening. No

## 2016-09-08 LAB — AMB EXT HGBA1C: Hemoglobin A1c, External: 6.2

## 2016-09-26 ENCOUNTER — Ambulatory Visit: Admit: 2016-09-26 | Discharge: 2016-09-26 | Payer: MEDICARE | Attending: Internal Medicine | Primary: Internal Medicine

## 2016-09-26 DIAGNOSIS — E1129 Type 2 diabetes mellitus with other diabetic kidney complication: Secondary | ICD-10-CM

## 2016-09-26 NOTE — ACP (Advance Care Planning) (Signed)
End of life planning discussed with patient.  Patient states that they do not have an advance care plan at this time.  Information has been provided in today's after visit summary with directions on how to contact our office to schedule an appointment with our nurse navigators for this service.  Patient verbalized understanding.

## 2016-09-26 NOTE — Progress Notes (Signed)
Chief Complaint   Patient presents with   ??? Cyst     Patient is here for a follow up visit for a cyst on the left side of the shoulder      1. Have you been to the ER, urgent care clinic since your last visit?  Hospitalized since your last visit? No     2. Have you seen or consulted any other health care providers outside of the [facility] since your last visit?  Include any pap smears or colon screening.  No       Visit Vitals   ??? BP (!) 115/92 (BP 1 Location: Left arm, BP Patient Position: Sitting)   ??? Pulse 81   ??? Temp 97.9 ??F (36.6 ??C) (Oral)   ??? Resp 18   ??? Ht 6' 1.5" (1.867 m)   ??? Wt 232 lb (105.2 kg)   ??? BMI 30.19 kg/m2

## 2016-09-26 NOTE — Patient Instructions (Signed)
As discussed in your appointment today, Advance Care Planning is an important part of planning for your healthcare future. Discussing your preferences with your family and your care team is a part of good healthcare so that we can be guided by your known values and goals. Our office offers this service at no cost to you. Our Nurse Navigators and certified Respecting Choices ?? Facilitators, Claire and Joyce typically schedule family appointments for this service on Wednesdays. To schedule an Advance Care Planning visit or to receive more information about this service, please call [facility] at [phone] and ask to speak directly to Claire Ballard or Joyce Rusincovitch

## 2016-09-26 NOTE — Progress Notes (Signed)
HPI:  Stanley Holt is a 76 y.o. year old male who returns to clinic today for follow up: hypothyroid, AODM, hyperlipidemia and history of prostate cancer.   1. Infected sebaceous cyst much improved but still having some draining.  He has completed his course of antibiotics.  1. Cardiovascular: He reports taking medications as instructed, no medication side effects noted, The home BP readings outside of office: not checking. Diet and Lifestyle: generally follows a low fat low cholesterol diet, generally follows a low sodium diet. Exercise: walking ??  2. AODM with microalbuminuria; ??Since last visit: reviewed last labs and is followed by Dr Ace Gins endocrine and last hgba1c 6.2. Home glucose monitoring: am 150-180. And pm 90-120. He reports medication compliance: compliant all of the time. He reports the following medication side effects: none. Diabetic diet compliance: compliant most of the time.   3. Hypothyroid: followed by Dr Ace Gins.  Thyroid ROS: denies fatigue, weight changes, heat/cold intolerance, bowel/skin changes or CVS symptoms.   4. History of prostate cancer and followed by urology.   5.  History of head and neck cancer and is followed by oncology and no recurrence.  Current Outpatient Prescriptions   Medication Sig Dispense Refill   ??? lisinopril (PRINIVIL, ZESTRIL) 5 mg tablet Take  by mouth daily.     ??? cholecalciferol (VITAMIN D3) 1,000 unit tablet Take  by mouth daily.     ??? levothyroxine (SYNTHROID) 50 mcg tablet Take 75 mcg by mouth Daily (before breakfast).     ??? aspirin delayed-release 81 mg tablet Take 1 Tab by mouth daily. 30 Tab 11   ??? glimepiride (AMARYL) 4 mg tablet Take  by mouth two (2) times a day.     ??? FERROUS FUMARATE/VIT BCOMP&C (SUPER B COMPLEX PO) Take  by mouth.       ??? glucose blood VI test strips (ASCENSIA AUTODISC VI, ONE TOUCH ULTRA TEST VI) strip 1 Each by Does Not Apply route. Pt testing once daily. 150 Strip 3    ??? metformin ER (GLUCOPHAGE XR) 500 mg tablet Take 500 mg by mouth two (2) times a day. 2 qpm  Indications: TYPE 2 DIABETES MELLITUS     ??? ascorbic acid (VITAMIN C) 1,000 mg tablet Take  by mouth.     ??? Saliva Stimulant Agents Comb.3 (BIOTENE MOISTURIZING MOUTH) Spry 1 Spray by Mucous Membrane route once as needed.       Allergies   Allergen Reactions   ??? Chlorhexidine Contact Dermatitis   ??? Levofloxacin Swelling   ??? Tape [Adhesive] Contact Dermatitis     Clear surgical tape over port.     Social History   Substance Use Topics   ??? Smoking status: Former Smoker     Years: 40.00     Quit date: 09/04/2002   ??? Smokeless tobacco: Never Used   ??? Alcohol use 0.5 oz/week     1 Cans of beer per week         Review of Systems   Constitutional: Negative for malaise/fatigue.   Respiratory: Negative for shortness of breath.    Cardiovascular: Negative for chest pain and leg swelling.   Gastrointestinal: Negative for abdominal pain and heartburn.   Neurological: Positive for dizziness (occasional vertigo when bends over and turns head.). Negative for headaches.       Physical Exam   Constitutional: He appears well-developed. No distress.   BP (!) 115/92 (BP 1 Location: Left arm, BP Patient Position: Sitting)   Pulse 81   Temp  97.9 ??F (36.6 ??C) (Oral)    Resp 18   Ht 6' 1.5" (1.867 m)   Wt 232 lb (105.2 kg)   BMI 30.19 kg/m2   Neck: Carotid bruit is not present. No thyromegaly present.   Cardiovascular: Normal rate, regular rhythm, normal heart sounds and intact distal pulses.    No murmur heard.  Pulmonary/Chest: Effort normal and breath sounds normal. He has no wheezes.   Abdominal: Soft. Bowel sounds are normal. There is no tenderness.   Musculoskeletal: He exhibits no edema.   Psychiatric: He has a normal mood and affect.   Vitals reviewed.    Left posterior arm draining sebaceous cyst.  Mabelle Mungin cystic material expressed.  No surrounding erythema heat or swelling.        Assessment & Plan:    ICD-10-CM ICD-9-CM     1. Controlled type 2 diabetes mellitus with microalbuminuria, without long-term current use of insulin (HCC) E11.29 250.40     R80.9 791.0    2. Sebaceous cyst  No longer appears infected sebaceous material is expressed today on exam and area is dressed.  Counseled to allow area to continue to drain as needed.  If develops any redness heat or swelling will need follow-up.  If this continues to drain will need to see his dermatologist regarding removal. L72.3 706.2    3. Acquired hypothyroidism  Followed by endocrine and denies any problems today.  States his lab work has been at goal.   E03.9 244.9    4. Head and neck cancer ([place])  Followed by oncology and no recurrence. C76.0 195.0    5. Prostate CA [facility]) followed by urology no recurrence.   C61 185    6. Osteoarthritis, unspecified osteoarthritis type, unspecified site M19.90 715.90         Follow-up Disposition:  Return in about 6 months (around 03/26/2017) for follow up.   Advised him to call back or return to office if symptoms worsen/change/persist.  Discussed expected course/resolution/complications of diagnosis in detail with patient.    Medication risks/benefits/costs/interactions/alternatives discussed with patient.  He was given an after visit summary which includes diagnoses, current medications, & vitals.  He expressed understanding with the diagnosis and plan.

## 2016-09-27 DIAGNOSIS — E1129 Type 2 diabetes mellitus with other diabetic kidney complication: Secondary | ICD-10-CM

## 2016-10-31 ENCOUNTER — Encounter: Attending: Internal Medicine | Primary: Internal Medicine

## 2017-01-05 LAB — AMB EXT LDL-C: LDL-C, External: 67

## 2017-01-05 LAB — AMB EXT CREATININE: Creatinine, External: 1.14

## 2017-01-05 LAB — AMB EXT HGBA1C: Hemoglobin A1c, External: 6.4

## 2017-01-05 LAB — AMB EXT URINE MICROALBUMIN: Urine Microalbumin, External: 63.2

## 2017-03-27 ENCOUNTER — Encounter: Attending: Internal Medicine | Primary: Internal Medicine

## 2017-05-02 ENCOUNTER — Ambulatory Visit: Admit: 2017-05-02 | Discharge: 2017-05-02 | Payer: MEDICARE | Attending: Internal Medicine | Primary: Internal Medicine

## 2017-05-02 DIAGNOSIS — E1129 Type 2 diabetes mellitus with other diabetic kidney complication: Secondary | ICD-10-CM

## 2017-05-02 MED ORDER — VARICELLA-ZOSTER GLYCOE VACC-AS01B ADJ(PF) 50 MCG/0.5 ML IM SUSPENSION
50 mcg/0.5 mL | INTRAMUSCULAR | 1 refills | Status: DC
Start: 2017-05-02 — End: 2017-08-20

## 2017-05-02 NOTE — ACP (Advance Care Planning) (Signed)
End of life planning discussed with patient.  Patient states that they do not have an advance care plan at this time.  Information has been provided in today's after visit summary with directions on how to contact our office to schedule an appointment with our nurse navigators for this service.  Patient verbalized understanding.

## 2017-05-02 NOTE — Progress Notes (Signed)
HPI:  Stanley Holt is a 76 y.o. year old male who returns to clinic today for follow up: AODM, hyperlipidemia and history of prostate cancer and hypothyroid.   1. Cardiovascular: He reports taking medications as instructed, no medication side effects noted, The home BP readings outside of office: not checking. . Diet and Lifestyle: generally follows a low fat low cholesterol diet, generally follows a low sodium diet. Exercise: walking     2. AODM with microalbuminuria;  Since last visit: reviewed last labs and is followed by Stanley Holt endocrine and last hgba1c 6.4. Home glucose monitoring:  He reports medication compliance: compliant all of the time. He reports the following medication side effects: none. Diabetic diet compliance: compliant most of the time.   3. Hypothyroid: Thyroid ROS: denies fatigue, weight changes, heat/cold intolerance, bowel/skin changes or CVS symptoms.   4. History of prostate cancer and followed by urology. No recurrence and last PSA not detected per pt.   5. Hx or head and neck cancer and followed by oncology. Has had a sore bump inner lower lip for past 6 months not increasing in size.   Current Outpatient Prescriptions   Medication Sig Dispense Refill   ??? lisinopril (PRINIVIL, ZESTRIL) 5 mg tablet Take  by mouth daily.     ??? cholecalciferol (VITAMIN D3) 1,000 unit tablet Take  by mouth daily.     ??? levothyroxine (SYNTHROID) 50 mcg tablet Take 75 mcg by mouth Daily (before breakfast).     ??? aspirin delayed-release 81 mg tablet Take 1 Tab by mouth daily. 30 Tab 11   ??? glimepiride (AMARYL) 4 mg tablet Take  by mouth two (2) times a day.     ??? FERROUS FUMARATE/VIT BCOMP&C (SUPER B COMPLEX PO) Take  by mouth.       ??? glucose blood VI test strips (ASCENSIA AUTODISC VI, ONE TOUCH ULTRA TEST VI) strip 1 Each by Does Not Apply route. Pt testing once daily. 150 Strip 3   ??? metformin ER (GLUCOPHAGE XR) 500 mg tablet Take 500 mg by mouth two (2) times a day. 2 qpm  Indications: TYPE 2 DIABETES MELLITUS      ??? ascorbic acid (VITAMIN C) 1,000 mg tablet Take  by mouth.     ??? Saliva Stimulant Agents Comb.3 (BIOTENE MOISTURIZING MOUTH) Spry 1 Spray by Mucous Membrane route once as needed.       Allergies   Allergen Reactions   ??? Chlorhexidine Contact Dermatitis   ??? Levofloxacin Swelling   ??? Tape [Adhesive] Contact Dermatitis     Clear surgical tape over port.     Social History   Substance Use Topics   ??? Smoking status: Former Smoker     Years: 40.00     Quit date: 09/04/2002   ??? Smokeless tobacco: Never Used   ??? Alcohol use 0.5 oz/week     1 Cans of beer per week         Review of Systems   Constitutional: Negative for malaise/fatigue.   Respiratory: Negative for shortness of breath.    Cardiovascular: Negative for chest pain and leg swelling.   Gastrointestinal: Negative for abdominal pain and heartburn.   Neurological: Negative for dizziness and headaches.       Physical Exam   Constitutional: He appears well-developed. No distress.   BP 128/72 (BP 1 Location: Left arm, BP Patient Position: Sitting)   Pulse 85   Temp 98 ??F (36.7 ??C) (Oral)    Resp 16   Ht  6' 1.5" (1.867 m)   Wt 225 lb 6.4 oz (102.2 kg)   SpO2 96%   BMI 29.33 kg/m2   HENT:   Head: Normocephalic and atraumatic.   Nose: Nose normal.   Mouth/Throat: Oropharynx is clear and moist.   At the base of his lower inner lip is a palpable firm nodule which is tender to palpation.   Eyes: Conjunctivae are normal. Pupils are equal, round, and reactive to light.   Neck: Carotid bruit is not present. No thyromegaly present.   Cardiovascular: Normal rate, regular rhythm, normal heart sounds and intact distal pulses.    No murmur heard.  Pulmonary/Chest: Effort normal and breath sounds normal. He has no wheezes.   Abdominal: Soft. Bowel sounds are normal. There is no tenderness.   Musculoskeletal: He exhibits no edema.   Lymphadenopathy:     He has no cervical adenopathy.   Psychiatric: He has a normal mood and affect.   Vitals reviewed.       Monofilament intact bilaterally. Pulses 2+ bilaterally. No skin lesions or open wounds. Bilateral hammar toe.      Assessment & Plan:    ICD-10-CM ICD-9-CM    1. Controlled type 2 diabetes mellitus with microalbuminuria, without long-term current use of insulin (Stanley Holt)  At goal and will continue with current medication and diet and is followed by endocrine. E11.29 250.40     R80.9 791.0    2. Acquired hypothyroidism  At goal continue current medication.   E03.9 244.9    3. History of prostate cancer  Followed closely by urology and no evidence of recurrence. Z85.46 V10.46    4. History of head and neck cancer  Followed by oncology Z85.89 V10.89    5. Mouth lesion   Patient will schedule appointment with his ENT.   K13.70 528.9 REFERRAL TO ENT-OTOLARYNGOLOGY   6. Medicare annual wellness visit, subsequent  Health maintenance reviewed and updated with patient today at visit.   Z00.00 V70.0    7. Screening for depression Z13.89 V79.0 DEPRESSION SCREEN ANNUAL   8. Screening for alcoholism Z13.89 V79.1 PR ANNUAL ALCOHOL SCREEN 15 MIN     Sees podiatry of nail care and foot check.  Will request records.    Follow-up Disposition:  Return in about 6 months (around 11/01/2017) for follow up.   Advised him to call back or return to office if symptoms worsen/change/persist.  Discussed expected course/resolution/complications of diagnosis in detail with patient.    Medication risks/benefits/costs/interactions/alternatives discussed with patient.  He was given an after visit summary which includes diagnoses, current medications, & vitals.  He expressed understanding with the diagnosis and plan.       This is also the Subsequent Medicare Annual Wellness Exam, performed 12 months or more after the Initial AWV or the last Subsequent AWV    I have reviewed the patient's medical history in detail and updated the computerized patient record.     History     Past Medical History:   Diagnosis Date   ??? 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   ??? Diabetes ([place])    ??? DM w/o Complication Type II, Uncontrolled 06/23/2008   ??? Hypothyroid 10/23/2013   ??? Microalbuminuria    ??? Microalbuminuria    ??? Osteoarthritis 02/03/2009   ??? Prostate CA ([place]) 10/19/2011   ??? Unspecified essential hypertension 06/23/2008      Past Surgical History:   Procedure Laterality Date   ??? ENDOSCOPY, COLON, DIAGNOSTIC     ???  HX HERNIA REPAIR      bilateral   ??? 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 Nelva Bush    ??? HX ORTHOPAEDIC  06/2007    lumbar laminectomy   ??? HX ORTHOPAEDIC      right shoulder surgery     Current Outpatient Prescriptions   Medication Sig Dispense Refill   ??? lisinopril (PRINIVIL, ZESTRIL) 5 mg tablet Take  by mouth daily.     ??? cholecalciferol (VITAMIN D3) 1,000 unit tablet Take  by mouth daily.     ??? levothyroxine (SYNTHROID) 50 mcg tablet Take 75 mcg by mouth Daily (before breakfast).     ??? aspirin delayed-release 81 mg tablet Take 1 Tab by mouth daily. 30 Tab 11   ??? glimepiride (AMARYL) 4 mg tablet Take  by mouth two (2) times a day.     ??? FERROUS FUMARATE/VIT BCOMP&C (SUPER B COMPLEX PO) Take  by mouth.       ??? glucose blood VI test strips (ASCENSIA AUTODISC VI, ONE TOUCH ULTRA TEST VI) strip 1 Each by Does Not Apply route. Pt testing once daily. 150 Strip 3   ??? metformin ER (GLUCOPHAGE XR) 500 mg tablet Take 500 mg by mouth two (2) times a day. 2 qpm  Indications: TYPE 2 DIABETES MELLITUS     ??? ascorbic acid (VITAMIN C) 1,000 mg tablet Take  by mouth.     ??? Saliva Stimulant Agents Comb.3 (BIOTENE MOISTURIZING MOUTH) Spry 1 Spray by Mucous Membrane route once as needed.       Allergies   Allergen Reactions   ??? Chlorhexidine Contact Dermatitis   ??? Levofloxacin Swelling   ??? Tape [Adhesive] Contact Dermatitis     Clear surgical tape over port.     Family History   Problem Relation Age of Onset   ??? Cancer Mother      breast   ??? Diabetes Brother    ??? Arthritis-rheumatoid Father    ??? Arthritis-rheumatoid Sister       Social History   Substance Use Topics   ??? Smoking status: Former Smoker     Years: 40.00     Quit date: 09/04/2002   ??? Smokeless tobacco: Never Used   ??? Alcohol use 0.5 oz/week     1 Cans of beer per week     Patient Active Problem List   Diagnosis Code   ??? 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 History of prostate cancer Z85.46   ??? History of head and neck cancer Z85.89   ??? Hypothyroid E03.9   ??? Advanced care planning/counseling discussion Z71.89   ??? Controlled type 2 diabetes mellitus with microalbuminuria, without long-term current use of insulin (HCC) E11.29, R80.9       Depression Risk Factor Screening:     PHQ over the last two weeks 05/02/2017   PHQ Not Done -   Little interest or pleasure in doing things Not at all   Feeling down, depressed, irritable, or hopeless Not at all   Total Score PHQ 2 0     Alcohol Risk Factor Screening:   Reviewed and not at risk.      Functional Ability and Level of Safety:   Hearing Loss  Some decrease.     Activities of Daily Living  The home contains: no safety equipment.  Patient does total self care    Fall Risk  Fall Risk Assessment, last 12 mths  05/02/2017   Able to walk? Yes   Fall in past 12 months? No   Fall with injury? -   Number of falls in past 12 months -   Fall Risk Score -       Abuse Screen  Patient is not abused    Cognitive Screening   Evaluation of Cognitive Function:  Has your family/caregiver stated any concerns about your memory: no  Some very mild decrease per pt since had chemo but no changes.     Patient Care Team   Patient Care Team:  Gloriajean Dell, MD as PCP - General  Jaci Carrel, MD (Hematology and Oncology)  Pearlie Oyster, MD (Urology)  Matilde Haymaker, MD (Otolaryngology)  Becky Augusta, MD (Internal Medicine)  Geronimo Running  Clydene Pugh, RN as Nurse Navigator (Internal Medicine)    Assessment/Plan   Education and counseling provided:  Are appropriate based on today's review and evaluation        Health Maintenance Due    Topic Date Due   ??? Influenza Age 39 to Adult  04/04/2017   ??? COLONOSCOPY  09/04/2017

## 2017-05-02 NOTE — Patient Instructions (Addendum)
Medicare Wellness Visit, Male    The best way to live healthy is to have a lifestyle where you eat a well-balanced diet, exercise regularly, limit alcohol use, and quit all forms of tobacco/nicotine, if applicable.   Regular preventive services are another way to keep healthy. Preventive services (vaccines, screening tests, monitoring & exams) can help personalize your care plan, which helps you manage your own care. Screening tests can find health problems at the earliest stages, when they are easiest to treat.   [place] follows the current, evidence-based guidelines published by the [place] [employer] (USPSTF) when recommending preventive services for our patients. Because we follow these guidelines, sometimes recommendations change over time as research supports it. (For example, a prostate screening blood test is no longer routinely recommended for men with no symptoms.)  Of course, you and your doctor may decide to screen more often for some diseases, based on your risk and co-morbidities (chronic disease you are already diagnosed with).   Preventive services for you include:  - Medicare offers their members a free annual wellness visit, which is time for you and your primary care provider to discuss and plan for your preventive service needs. Take advantage of this benefit every year!  -All adults over age 63 should receive the recommended pneumonia vaccines. Current USPSTF guidelines recommend a series of two vaccines for the best pneumonia protection.   -All adults should have a flu vaccine yearly and an ECG. All adults age 68 and older should receive a shingles vaccine once in their lifetime.    -All adults age 54-70 who are overweight should have a diabetes screening test once every three years.   -Other screening tests & preventive services for persons with diabetes include: an eye exam to screen for diabetic retinopathy, a kidney function  test, a foot exam, and stricter control over your cholesterol.   -Cardiovascular screening for adults with routine risk involves an electrocardiogram (ECG) at intervals determined by the provider.   -Colorectal cancer screening should be done for adults age 71-75 with no increased risk factors for colorectal cancer.  There are a number of acceptable methods of screening for this type of cancer. Each test has its own benefits and drawbacks. Discuss with your provider what is most appropriate for you during your annual wellness visit. The different tests include: colonoscopy (considered the best screening method), a fecal occult blood test, a fecal DNA test, and sigmoidoscopy.  -All adults born between [place] should be screened once for Hepatitis C.  -An Abdominal Aortic Aneurysm (AAA) Screening is recommended for men age 68-75 who has ever smoked in their lifetime.     Here is a list of your current Health Maintenance items (your personalized list of preventive services) with a due date:  Health Maintenance Due   Topic Date Due   ??? Flu Vaccine  04/04/2017   ??? Colonoscopy  09/04/2017   As discussed in your appointment today, [place] is an important part of planning for your healthcare future. Discussing your preferences with your family and your care team is a part of good healthcare so that we can be guided by your known values and goals. Our office offers this service at no cost to you. Our Nurse Navigators and certified Respecting Choices ?? Facilitators, Lyndee Leo and Blanch Media typically schedule family appointments for this service on Wednesdays. To schedule an Advance Care Planning visit or to receive more information about this service,  please call [facility] at [phone] and ask to speak directly to Verdene Lennert or [place]

## 2017-05-02 NOTE — Progress Notes (Signed)
Chief Complaint   Patient presents with   ??? Diabetes   ??? Hypertension   ??? Mouth Lesions     inside bottom lip     Patient states he is here for his 6 month follow up.  Patient states he has a mouth sore on the inside of his bottom lip.

## 2017-07-05 LAB — AMB EXT LDL-C: LDL-C, External: 51

## 2017-07-05 LAB — AMB EXT HGBA1C: Hemoglobin A1c, External: 6.7

## 2017-07-05 LAB — AMB EXT CREATININE: Creatinine, External: 1.14

## 2017-08-20 ENCOUNTER — Ambulatory Visit: Admit: 2017-08-20 | Discharge: 2017-08-20 | Payer: MEDICARE | Attending: Internal Medicine | Primary: Internal Medicine

## 2017-08-20 DIAGNOSIS — L02214 Cutaneous abscess of groin: Secondary | ICD-10-CM

## 2017-08-20 MED ORDER — CEPHALEXIN 500 MG CAP
500 mg | ORAL_CAPSULE | Freq: Three times a day (TID) | ORAL | 0 refills | Status: AC
Start: 2017-08-20 — End: 2017-08-27

## 2017-08-20 NOTE — Progress Notes (Signed)
Stanley Holt is a 76 y.o. male who was seen in clinic today (08/20/2017) for an acute visit.        Assessment & Plan:   Diagnoses and all orders for this visit:    1. Abscess of right groin- this is a new problem, differential dx reviewed with the patient, exact etiology is unclear at this time.  It looks like an abscess but is not behaving like an abscess.  Will treat w/ medications below.  If no resolution will need imaging due to #2 and #3  Red flags were reviewed with the patient to RTC or notify me, expected time course for resolution reviewed.  See AVS.   -     cephALEXin (KEFLEX) 500 mg capsule; Take 1 Cap by mouth three (3) times daily for 7 days.    2. History of prostate cancer    3. History of head and neck cancer         Follow-up Disposition:  Return if symptoms worsen or fail to improve.      Subjective:   Stanley Holt was seen today for Skin Problem    Dermatology Review  He is here to talk about a possible infection.  He noticed it 1 month ago, with worsening in the last few days.  Location: right groin.  He reports it started draining 3 days ago, reddish liquid.  Symptoms include swelling.  Treatment to date has included antibiotic ointment.  He denies any fevers or chills, pain, or trauma.         Prior to Admission medications    Medication Sig Start Date End Date Taking? Authorizing Provider   levothyroxine (SYNTHROID) 75 mcg tablet Take  by mouth Daily (before breakfast).   Yes Provider, Historical   lisinopril (PRINIVIL, ZESTRIL) 5 mg tablet Take  by mouth daily.   Yes Provider, Historical   cholecalciferol (VITAMIN D3) 1,000 unit tablet Take  by mouth daily.   Yes Provider, Historical   aspirin delayed-release 81 mg tablet Take 1 Tab by mouth daily.   Yes Gloriajean Dell, MD   glimepiride (AMARYL) 4 mg tablet Take  by mouth two (2) times a day.   Yes Provider, Historical   FERROUS FUMARATE/VIT BCOMP&C (SUPER B COMPLEX PO) Take  by mouth.     Yes Provider, Historical    glucose blood VI test strips (ASCENSIA AUTODISC VI, ONE TOUCH ULTRA TEST VI) strip 1 Each by Does Not Apply route. Pt testing once daily. 06/28/10  Yes Gloriajean Dell, MD   metformin ER (GLUCOPHAGE XR) 500 mg tablet Take 500 mg by mouth two (2) times a day. 2 qpm  Indications: TYPE 2 DIABETES MELLITUS   Yes Provider, Historical   ascorbic acid (VITAMIN C) 1,000 mg tablet Take  by mouth.   Yes Provider, Historical   Saliva Stimulant Agents Comb.3 (BIOTENE MOISTURIZING MOUTH) Spry 1 Spray by Mucous Membrane route once as needed. 12/02/09  Yes Provider, Historical          Allergies   Allergen Reactions   ??? Chlorhexidine Contact Dermatitis   ??? Levofloxacin Swelling   ??? Tape [Adhesive] Contact Dermatitis     Clear surgical tape over port.           ROS - per HPI        Objective:   Physical Exam   Abdominal: A hernia is present. Hernia confirmed positive in the left inguinal area.   Genitourinary:  Visit Vitals  BP 112/78   Pulse 72   Temp 97.7 ??F (36.5 ??C) (Oral)   Resp 16   Ht 6' 1.5" (1.867 m)   Wt 227 lb (103 kg)   SpO2 99%   BMI 29.54 kg/m??         Disclaimer:  Advised him to call back or return to office if symptoms worsen/change/persist.  Discussed expected course/resolution/complications of diagnosis in detail with patient.    Medication risks/benefits/costs/interactions/alternatives discussed with patient.  He was given an after visit summary which includes diagnoses, current medications, & vitals.  He expressed understanding with the diagnosis and plan.      Aspects of this note may have been generated using voice recognition software. Despite editing, there may be some syntax errors.       Antony Haste, MD

## 2017-08-20 NOTE — Patient Instructions (Signed)
Cellulitis: Care Instructions  Your Care Instructions    Cellulitis is a skin infection caused by bacteria, most often strep or staph. It often occurs after a break in the skin from a scrape, cut, bite, or puncture, or after a rash.  Cellulitis may be treated without doing tests to find out what caused it. But your doctor may do tests, if needed, to look for a specific bacteria, like methicillin-resistant Staphylococcus aureus (MRSA).  The doctor has checked you carefully, but problems can develop later. If you notice any problems or new symptoms, get medical treatment right away.  Follow-up care is a key part of your treatment and safety. Be sure to make and go to all appointments, and call your doctor if you are having problems. It's also a good idea to know your test results and keep a list of the medicines you take.  How can you care for yourself at home?  ?? Take your antibiotics as directed. Do not stop taking them just because you feel better. You need to take the full course of antibiotics.  ?? Prop up the infected area on pillows to reduce pain and swelling. Try to keep the area above the level of your heart as often as you can.  ?? If your doctor told you how to care for your wound, follow your doctor's instructions. If you did not get instructions, follow this general advice:  ? Wash the wound with clean water 2 times a day. Don't use hydrogen peroxide or alcohol, which can slow healing.  ? You may cover the wound with a thin layer of petroleum jelly, such as Vaseline, and a nonstick bandage.  ? Apply more petroleum jelly and replace the bandage as needed.  ?? Be safe with medicines. Take pain medicines exactly as directed.  ? If the doctor gave you a prescription medicine for pain, take it as prescribed.  ? If you are not taking a prescription pain medicine, ask your doctor if you can take an over-the-counter medicine.  To prevent cellulitis in the future   ?? Try to prevent cuts, scrapes, or other injuries to your skin. Cellulitis most often occurs where there is a break in the skin.  ?? If you get a scrape, cut, mild burn, or bite, wash the wound with clean water as soon as you can to help avoid infection. Don't use hydrogen peroxide or alcohol, which can slow healing.  ?? If you have swelling in your legs (edema), support stockings and good skin care may help prevent leg sores and cellulitis.  ?? Take care of your feet, especially if you have diabetes or other conditions that increase the risk of infection. Wear shoes and socks. Do not go barefoot. If you have athlete's foot or other skin problems on your feet, talk to your doctor about how to treat them.  When should you call for help?  Call your doctor now or seek immediate medical care if:  ?? ?? You have signs that your infection is getting worse, such as:  ? Increased pain, swelling, warmth, or redness.  ? Red streaks leading from the area.  ? Pus draining from the area.  ? A fever.   ?? ?? You get a rash.   ??Watch closely for changes in your health, and be sure to contact your doctor if:  ?? ?? You do not get better as expected.   Where can you learn more?  Go to http://www.healthwise.net/[employer].  Enter X309 in the search   box to learn more about "Cellulitis: Care Instructions."  Current as of: December 20, 2016  Content Version: 11.8  ?? 2006-2018 [employer]. Care instructions adapted under license by [employer] (which disclaims liability or warranty for this information). If you have questions about a medical condition or this instruction, always ask your healthcare professional. [employer] disclaims any warranty or liability for your use of this information.

## 2017-08-20 NOTE — Progress Notes (Signed)
Sore, draining area on right lower abdomen since Saturday. Bump had been present for about a month.

## 2017-11-01 ENCOUNTER — Encounter: Attending: Internal Medicine | Primary: Internal Medicine

## 2018-04-02 LAB — AMB EXT LDL-C
LDL-C, External: 57
LDL-C, External: 57 NA

## 2018-04-02 LAB — AMB EXT CREATININE
Creatinine, External: 1.18
Creatinine, External: 1.18 NA

## 2018-04-02 LAB — AMB EXT HGBA1C
Hemoglobin A1C, External: 6.6 NA
Hemoglobin A1c, External: 6.6

## 2018-06-20 ENCOUNTER — Ambulatory Visit: Attending: Internal Medicine | Primary: Internal Medicine

## 2018-06-20 ENCOUNTER — Ambulatory Visit: Admit: 2018-06-20 | Discharge: 2018-06-20 | Payer: MEDICARE | Attending: Internal Medicine | Primary: Internal Medicine

## 2018-06-20 DIAGNOSIS — Z0000 Encounter for general adult medical examination without abnormal findings: Secondary | ICD-10-CM

## 2018-06-20 NOTE — ACP (Advance Care Planning) (Signed)
End of life planning discussed with patient.  Patient states that they do not have an advance care plan at this time.  Information has been provided in today's after visit summary with directions on how to contact our office to schedule an appointment with our nurse navigators for this service.  Patient verbalized understanding.

## 2018-06-20 NOTE — Progress Notes (Signed)
This is the Subsequent Medicare Annual Wellness Exam, performed 12 months or more after the Initial AWV or the last Subsequent AWV    I have reviewed the patient's medical history in detail and updated the computerized patient record.     History     Past Medical History:   Diagnosis Date   ??? 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   ??? Diabetes ([place])    ??? History of prostate cancer 10/19/2011    Diagnosis 9/11   ??? Hypothyroid 10/23/2013   ??? Microalbuminuria    ??? Osteoarthritis 02/03/2009   ??? Prostate CA ([place]) 10/19/2011   ??? Unspecified essential hypertension 06/23/2008      Past Surgical History:   Procedure Laterality Date   ??? ENDOSCOPY, COLON, DIAGNOSTIC     ??? HX HERNIA REPAIR      bilateral   ??? 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 Nelva Bush    ??? HX ORTHOPAEDIC  06/2007    lumbar laminectomy   ??? HX ORTHOPAEDIC      right shoulder surgery     Current Outpatient Medications   Medication Sig Dispense Refill   ??? levothyroxine (SYNTHROID) 75 mcg tablet Take  by mouth Daily (before breakfast).     ??? lisinopril (PRINIVIL, ZESTRIL) 5 mg tablet Take  by mouth daily.     ??? cholecalciferol (VITAMIN D3) 1,000 unit tablet Take  by mouth daily.     ??? aspirin delayed-release 81 mg tablet Take 1 Tab by mouth daily. 30 Tab 11   ??? glimepiride (AMARYL) 4 mg tablet Take  by mouth two (2) times a day.     ??? FERROUS FUMARATE/VIT BCOMP&C (SUPER B COMPLEX PO) Take  by mouth.       ??? glucose blood VI test strips (ASCENSIA AUTODISC VI, ONE TOUCH ULTRA TEST VI) strip 1 Each by Does Not Apply route. Pt testing once daily. 150 Strip 3   ??? metformin ER (GLUCOPHAGE XR) 500 mg tablet Take 500 mg by mouth two (2) times a day. 2 qpm  Indications: TYPE 2 DIABETES MELLITUS     ??? ascorbic acid (VITAMIN C) 1,000 mg tablet Take  by mouth.     ??? Saliva Stimulant Agents Comb.3 (BIOTENE MOISTURIZING MOUTH) Spry 1 Spray by Mucous Membrane route once as needed.       Allergies   Allergen Reactions   ??? Chlorhexidine Contact  Dermatitis   ??? Levofloxacin Swelling   ??? Tape [Adhesive] Contact Dermatitis     Clear surgical tape over port.     Family History   Problem Relation Age of Onset   ??? Cancer Mother         breast   ??? Diabetes Brother    ??? Arthritis-rheumatoid Father    ??? Arthritis-rheumatoid Sister      Social History     Tobacco Use   ??? Smoking status: Former Smoker     Years: 40.00     Last attempt to quit: 09/04/2002     Years since quitting: 15.8   ??? Smokeless tobacco: Never Used   Substance Use Topics   ??? Alcohol use: Yes     Alcohol/week: 0.8 standard drinks     Types: 1 Cans of beer per week     Patient Active Problem List   Diagnosis Code   ??? 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 History of prostate cancer Z85.46   ???  History of head and neck cancer Z85.89   ??? Hypothyroid E03.9   ??? Advanced care planning/counseling discussion Z71.89   ??? Controlled type 2 diabetes mellitus with microalbuminuria, without long-term current use of insulin ([facility]) E11.29, R80.9       Depression Risk Factor Screening:     3 most recent PHQ Screens 06/20/2018   PHQ Not Done -   Little interest or pleasure in doing things Not at all   Feeling down, depressed, irritable, or hopeless Not at all   Total Score PHQ 2 0     Alcohol Risk Factor Screening:   Reviewed and not at risk.      Functional Ability and Level of Safety:   Hearing Loss  Decreased and plans to have hearing test.     Activities of Daily Living  The home contains: no safety equipment.  Patient does total self care    Fall Risk  Fall Risk Assessment, last 12 mths 06/20/2018   Able to walk? Yes   Fall in past 12 months? No   Fall with injury? -   Number of falls in past 12 months -   Fall Risk Score -       Abuse Screen  Patient is not abused    Cognitive Screening   Evaluation of Cognitive Function:  Some times having a problem finding a word.      Patient Care Team   Patient Care Team:  Gloriajean Dell, MD as PCP - General  Guadalupe Maple Fanny Bien, MD (Hematology and  Oncology)  Delisio, Elwyn Lade, MD (Urology)  Matilde Haymaker, MD (Otolaryngology)  Puttkammer, Sandi Mariscal, MD (Internal Medicine)  Rikki Spearing, DPM (Podiatry)    Assessment/Plan   Education and counseling provided:  Are appropriate based on today's review and evaluation        Health Maintenance Due   Topic Date Due   ??? MICROALBUMIN Q1  01/05/2018     HPI:  Stanley Holt is also here today for follow-up of chronic medical conditions.  AODM, hyperlipidemia and history of prostate cancer and hypothyroid.   1. Cardiovascular: He reports taking medications as instructed, no medication side effects noted, The home BP readings outside of office: not checking. . Diet and Lifestyle: generally follows a low fat low cholesterol diet, generally follows a low sodium diet. Exercise: walking ????  2. AODM with microalbuminuria; ??Since last visit: reviewed last labs and is followed by Dr Ace Gins endocrine who also does his lab work.  Home glucose monitoring: Not checking recently. He reports medication compliance: compliant all of the time. He reports the following medication side effects: none. Diabetic diet compliance: compliant most of the time.   3. Hypothyroid: Thyroid ROS: denies fatigue, weight changes, heat/cold intolerance, bowel/skin changes or CVS symptoms.   4. History of prostate cancer and followed by urology. No recurrence.   5. Hx or head and neck cancer and followed by oncology.   Exercise: Plays golf.     Current Outpatient Medications   Medication Sig Dispense Refill   ??? levothyroxine (SYNTHROID) 75 mcg tablet Take  by mouth Daily (before breakfast).     ??? lisinopril (PRINIVIL, ZESTRIL) 5 mg tablet Take  by mouth daily.     ??? cholecalciferol (VITAMIN D3) 1,000 unit tablet Take  by mouth daily.     ??? aspirin delayed-release 81 mg tablet Take 1 Tab by mouth daily. 30 Tab 11   ??? glimepiride (AMARYL) 4 mg tablet Take  by mouth two (2) times a day.     ??? FERROUS FUMARATE/VIT BCOMP&C (SUPER B COMPLEX PO) Take  by  mouth.       ??? glucose blood VI test strips (ASCENSIA AUTODISC VI, ONE TOUCH ULTRA TEST VI) strip 1 Each by Does Not Apply route. Pt testing once daily. 150 Strip 3   ??? metformin ER (GLUCOPHAGE XR) 500 mg tablet Take 500 mg by mouth two (2) times a day. 2 qpm  Indications: TYPE 2 DIABETES MELLITUS     ??? ascorbic acid (VITAMIN C) 1,000 mg tablet Take  by mouth.     ??? Saliva Stimulant Agents Comb.3 (BIOTENE MOISTURIZING MOUTH) Spry 1 Spray by Mucous Membrane route once as needed.       Allergies   Allergen Reactions   ??? Chlorhexidine Contact Dermatitis   ??? Levofloxacin Swelling   ??? Tape [Adhesive] Contact Dermatitis     Clear surgical tape over port.     Social History     Tobacco Use   ??? Smoking status: Former Smoker     Years: 40.00     Last attempt to quit: 09/04/2002     Years since quitting: 15.8   ??? Smokeless tobacco: Never Used   Substance Use Topics   ??? Alcohol use: Yes     Alcohol/week: 0.8 standard drinks     Types: 1 Cans of beer per week         ROS  Physical Exam   Monofilament intact bilaterally. Pulses 1+ bilaterally. No skin lesions or open wounds. Left 2nd toe crossing first.     Visit Vitals  BP 96/68 (BP 1 Location: Right arm)   Pulse 73   Temp 98.4 ??F (36.9 ??C) (Oral)   Resp 16   Ht 6' 1.5" (1.867 m)   Wt 224 lb (101.6 kg)   SpO2 94%   BMI 29.15 kg/m??       Assessment & Plan:    ICD-10-CM ICD-9-CM    1. Medicare annual wellness visit, subsequent  Health maintenance reviewed and updated with patient today at visit.   Z00.00 V70.0    2. Screening for alcoholism Z13.39 V79.1 PR ANNUAL ALCOHOL SCREEN 15 MIN   3. Screening for depression Z13.31 V79.0 DEPRESSION SCREEN ANNUAL   4. Controlled type 2 diabetes mellitus with microalbuminuria, without long-term current use of insulin ([place])  Counseled and will continue current regimen as per endocrine.  Declines any further labs here stating he has his labs to his endocrinologist. E11.29 250.40     R80.9 791.0    5. History of prostate cancer  Stable Z85.46  V10.46    6. Acquired hypothyroidism  At goal continue current medication. E03.9 244.9    7.  Overweight: Counseled BMI and healthy diet exercise.    declines lab and urine tests and states does through endocine. Decline colonoscopy or fit test.   Follow-up in 1 year for Medicare wellness.  Or earlier if any problems.  Advised him to call back or return to office if symptoms worsen/change/persist.  Discussed expected course/resolution/complications of diagnosis in detail with patient.  1 year medicare wellness.  Medication risks/benefits/costs/interactions/alternatives discussed with patient.  He was given an after visit summary which includes diagnoses, current medications, & vitals.  He expressed understanding with the diagnosis and plan.

## 2018-06-20 NOTE — Progress Notes (Signed)
This is the Subsequent Medicare Annual Wellness Exam, performed 12 months or more after the Initial AWV or the last Subsequent AWV    I have reviewed the patient's medical history in detail and updated the computerized patient record.     History     Past Medical History:   Diagnosis Date   ??? 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   ??? Diabetes ([place])    ??? History of prostate cancer 10/19/2011    Diagnosis 9/11   ??? Hypothyroid 10/23/2013   ??? Microalbuminuria    ??? Osteoarthritis 02/03/2009   ??? Prostate CA ([place]) 10/19/2011   ??? Unspecified essential hypertension 06/23/2008      Past Surgical History:   Procedure Laterality Date   ??? ENDOSCOPY, COLON, DIAGNOSTIC     ??? HX HERNIA REPAIR      bilateral   ??? 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 Nelva Bush    ??? HX ORTHOPAEDIC  06/2007    lumbar laminectomy   ??? HX ORTHOPAEDIC      right shoulder surgery     Current Outpatient Medications   Medication Sig Dispense Refill   ??? levothyroxine (SYNTHROID) 75 mcg tablet Take  by mouth Daily (before breakfast).     ??? lisinopril (PRINIVIL, ZESTRIL) 5 mg tablet Take  by mouth daily.     ??? cholecalciferol (VITAMIN D3) 1,000 unit tablet Take  by mouth daily.     ??? aspirin delayed-release 81 mg tablet Take 1 Tab by mouth daily. 30 Tab 11   ??? glimepiride (AMARYL) 4 mg tablet Take  by mouth two (2) times a day.     ??? FERROUS FUMARATE/VIT BCOMP&C (SUPER B COMPLEX PO) Take  by mouth.       ??? glucose blood VI test strips (ASCENSIA AUTODISC VI, ONE TOUCH ULTRA TEST VI) strip 1 Each by Does Not Apply route. Pt testing once daily. 150 Strip 3   ??? metformin ER (GLUCOPHAGE XR) 500 mg tablet Take 500 mg by mouth two (2) times a day. 2 qpm  Indications: TYPE 2 DIABETES MELLITUS     ??? ascorbic acid (VITAMIN C) 1,000 mg tablet Take  by mouth.     ??? Saliva Stimulant Agents Comb.3 (BIOTENE MOISTURIZING MOUTH) Spry 1 Spray by Mucous Membrane route once as needed.       Allergies   Allergen Reactions    ??? Chlorhexidine Contact Dermatitis   ??? Levofloxacin Swelling   ??? Tape [Adhesive] Contact Dermatitis     Clear surgical tape over port.     Family History   Problem Relation Age of Onset   ??? Cancer Mother         breast   ??? Diabetes Brother    ??? Arthritis-rheumatoid Father    ??? Arthritis-rheumatoid Sister      Social History     Tobacco Use   ??? Smoking status: Former Smoker     Years: 40.00     Last attempt to quit: 09/04/2002     Years since quitting: 15.8   ??? Smokeless tobacco: Never Used   Substance Use Topics   ??? Alcohol use: Yes     Alcohol/week: 0.8 standard drinks     Types: 1 Cans of beer per week     Patient Active Problem List   Diagnosis Code   ??? 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 History of prostate cancer Z85.46   ???  History of head and neck cancer Z85.89   ??? Hypothyroid E03.9   ??? Advanced care planning/counseling discussion Z71.89   ??? Controlled type 2 diabetes mellitus with microalbuminuria, without long-term current use of insulin (HCC) E11.29, R80.9       Depression Risk Factor Screening:     3 most recent PHQ Screens 06/20/2018   PHQ Not Done -   Little interest or pleasure in doing things Not at all   Feeling down, depressed, irritable, or hopeless Not at all   Total Score PHQ 2 0     Alcohol Risk Factor Screening:   Reviewed and not at risk.      Functional Ability and Level of Safety:   Hearing Loss  Decreased and plans to have hearing test.     Activities of Daily Living  The home contains: no safety equipment.  Patient does total self care    Fall Risk  Fall Risk Assessment, last 12 mths 06/20/2018   Able to walk? Yes   Fall in past 12 months? No   Fall with injury? -   Number of falls in past 12 months -   Fall Risk Score -       Abuse Screen  Patient is not abused    Cognitive Screening   Evaluation of Cognitive Function:  Some times having a problem finding a word.      Patient Care Team   Patient Care Team:  Gloriajean Dell, MD as PCP - General   Guadalupe Maple Fanny Bien, MD (Hematology and Oncology)  Delisio, Elwyn Lade, MD (Urology)  Matilde Haymaker, MD (Otolaryngology)  Puttkammer, Sandi Mariscal, MD (Internal Medicine)  Rikki Spearing, DPM (Podiatry)    Assessment/Plan   Education and counseling provided:  Are appropriate based on today's review and evaluation        Health Maintenance Due   Topic Date Due   ??? MICROALBUMIN Q1  01/05/2018     HPI:  Treshon is also here today for follow-up of chronic medical conditions.  AODM, hyperlipidemia and history of prostate cancer and hypothyroid.   1. Cardiovascular: He reports taking medications as instructed, no medication side effects noted, The home BP readings outside of office: not checking. . Diet and Lifestyle: generally follows a low fat low cholesterol diet, generally follows a low sodium diet. Exercise: walking ????  2. AODM with microalbuminuria; ??Since last visit: reviewed last labs and is followed by Dr Ace Gins endocrine who also does his lab work.  Home glucose monitoring: Not checking recently. He reports medication compliance: compliant all of the time. He reports the following medication side effects: none. Diabetic diet compliance: compliant most of the time.   3. Hypothyroid: Thyroid ROS: denies fatigue, weight changes, heat/cold intolerance, bowel/skin changes or CVS symptoms.   4. History of prostate cancer and followed by urology. No recurrence.   5. Hx or head and neck cancer and followed by oncology.   Exercise: Plays golf.     Current Outpatient Medications   Medication Sig Dispense Refill   ??? levothyroxine (SYNTHROID) 75 mcg tablet Take  by mouth Daily (before breakfast).     ??? lisinopril (PRINIVIL, ZESTRIL) 5 mg tablet Take  by mouth daily.     ??? cholecalciferol (VITAMIN D3) 1,000 unit tablet Take  by mouth daily.     ??? aspirin delayed-release 81 mg tablet Take 1 Tab by mouth daily. 30 Tab 11   ??? glimepiride (AMARYL) 4 mg tablet Take  by mouth two (2) times a day.      ??? FERROUS FUMARATE/VIT BCOMP&C (SUPER B COMPLEX PO) Take  by mouth.       ??? glucose blood VI test strips (ASCENSIA AUTODISC VI, ONE TOUCH ULTRA TEST VI) strip 1 Each by Does Not Apply route. Pt testing once daily. 150 Strip 3   ??? metformin ER (GLUCOPHAGE XR) 500 mg tablet Take 500 mg by mouth two (2) times a day. 2 qpm  Indications: TYPE 2 DIABETES MELLITUS     ??? ascorbic acid (VITAMIN C) 1,000 mg tablet Take  by mouth.     ??? Saliva Stimulant Agents Comb.3 (BIOTENE MOISTURIZING MOUTH) Spry 1 Spray by Mucous Membrane route once as needed.       Allergies   Allergen Reactions   ??? Chlorhexidine Contact Dermatitis   ??? Levofloxacin Swelling   ??? Tape [Adhesive] Contact Dermatitis     Clear surgical tape over port.     Social History     Tobacco Use   ??? Smoking status: Former Smoker     Years: 40.00     Last attempt to quit: 09/04/2002     Years since quitting: 15.8   ??? Smokeless tobacco: Never Used   Substance Use Topics   ??? Alcohol use: Yes     Alcohol/week: 0.8 standard drinks     Types: 1 Cans of beer per week         ROS  Physical Exam   Monofilament intact bilaterally. Pulses 1+ bilaterally. No skin lesions or open wounds. Left 2nd toe crossing first.     Visit Vitals  BP 96/68 (BP 1 Location: Right arm)   Pulse 73   Temp 98.4 ??F (36.9 ??C) (Oral)   Resp 16   Ht 6' 1.5" (1.867 m)   Wt 224 lb (101.6 kg)   SpO2 94%   BMI 29.15 kg/m??       Assessment & Plan:    ICD-10-CM ICD-9-CM    1. Medicare annual wellness visit, subsequent  Health maintenance reviewed and updated with patient today at visit.   Z00.00 V70.0    2. Screening for alcoholism Z13.39 V79.1 PR ANNUAL ALCOHOL SCREEN 15 MIN   3. Screening for depression Z13.31 V79.0 DEPRESSION SCREEN ANNUAL   4. Controlled type 2 diabetes mellitus with microalbuminuria, without long-term current use of insulin ([place])  Counseled and will continue current regimen as per endocrine.  Declines any further labs here stating he has his labs to his endocrinologist. E11.29 250.40      R80.9 791.0    5. History of prostate cancer  Stable Z85.46 V10.46    6. Acquired hypothyroidism  At goal continue current medication. E03.9 244.9    7.  Overweight: Counseled BMI and healthy diet exercise.    declines lab and urine tests and states does through endocine. Decline colonoscopy or fit test.   Follow-up in 1 year for Medicare wellness.  Or earlier if any problems.  Advised him to call back or return to office if symptoms worsen/change/persist.  Discussed expected course/resolution/complications of diagnosis in detail with patient.  1 year medicare wellness.  Medication risks/benefits/costs/interactions/alternatives discussed with patient.  He was given an after visit summary which includes diagnoses, current medications, & vitals.  He expressed understanding with the diagnosis and plan.

## 2018-06-20 NOTE — Patient Instructions (Addendum)
Medicare Wellness Visit, Male    The best way to live healthy is to have a lifestyle where you eat a well-balanced diet, exercise regularly, limit alcohol use, and quit all forms of tobacco/nicotine, if applicable.   Regular preventive services are another way to keep healthy. Preventive services (vaccines, screening tests, monitoring & exams) can help personalize your care plan, which helps you manage your own care. Screening tests can find health problems at the earliest stages, when they are easiest to treat.   [facility] follows the current, evidence-based guidelines published by the [place] [employer] (USPSTF) when recommending preventive services for our patients. Because we follow these guidelines, sometimes recommendations change over time as research supports it. (For example, a prostate screening blood test is no longer routinely recommended for men with no symptoms.)  Of course, you and your doctor may decide to screen more often for some diseases, based on your risk and co-morbidities (chronic disease you are already diagnosed with).   Preventive services for you include:  - Medicare offers their members a free annual wellness visit, which is time for you and your primary care provider to discuss and plan for your preventive service needs. Take advantage of this benefit every year!  -All adults over age 77 should receive the recommended pneumonia vaccines. Current USPSTF guidelines recommend a series of two vaccines for the best pneumonia protection.   -All adults should have a flu vaccine yearly and an ECG. All adults age 45 and older should receive a shingles vaccine once in their lifetime.    -All adults age 53-70 who are overweight should have a diabetes screening test once every three years.   -Other screening tests & preventive services for persons with diabetes include: an eye exam to screen for diabetic retinopathy, a kidney function  test, a foot exam, and stricter control over your cholesterol.   -Cardiovascular screening for adults with routine risk involves an electrocardiogram (ECG) at intervals determined by the provider.   -Colorectal cancer screening should be done for adults age 29-75 with no increased risk factors for colorectal cancer.  There are a number of acceptable methods of screening for this type of cancer. Each test has its own benefits and drawbacks. Discuss with your provider what is most appropriate for you during your annual wellness visit. The different tests include: colonoscopy (considered the best screening method), a fecal occult blood test, a fecal DNA test, and sigmoidoscopy.  -All adults born between [place] should be screened once for Hepatitis C.  -An Abdominal Aortic Aneurysm (AAA) Screening is recommended for men age 92-75 who has ever smoked in their lifetime.     Here is a list of your current Health Maintenance items (your personalized list of preventive services) with a due date:  Health Maintenance Due   Topic Date Due   ??? Albumin Urine Test  01/05/2018

## 2018-08-27 LAB — AMB EXT LDL-C
LDL-C, External: 72
LDL-C, External: 72 NA

## 2018-08-27 LAB — AMB EXT CREATININE
Creatinine, External: 1.35
Creatinine, External: 1.35 NA

## 2018-08-27 LAB — AMB EXT HGBA1C
Hemoglobin A1C, External: 6.6 NA
Hemoglobin A1c, External: 6.6

## 2018-09-10 LAB — AMB EXT URINE MICROALBUMIN: Urine Microalbumin, External: 107.8

## 2018-09-10 LAB — AMB EXT URINE ALBUMIN: MICROALBUMIN, URINE, EXTERNAL: 107.8 NA

## 2019-06-16 ENCOUNTER — Emergency Department: Admit: 2019-06-16 | Payer: MEDICARE | Primary: Internal Medicine

## 2019-06-16 ENCOUNTER — Inpatient Hospital Stay
Admit: 2019-06-16 | Discharge: 2019-06-17 | Disposition: A | Discharge status: Home / Self Care | Payer: MEDICARE | Attending: Emergency Medicine

## 2019-06-16 DIAGNOSIS — B349 Viral infection, unspecified: Secondary | ICD-10-CM

## 2019-06-16 LAB — METABOLIC PANEL, COMPREHENSIVE
A-G Ratio: 0.9 — ABNORMAL LOW (ref 1.1–2.2)
ALT (SGPT): 26 U/L (ref 12–78)
AST (SGOT): 23 U/L (ref 15–37)
Albumin: 3.8 g/dL (ref 3.5–5.0)
Alk. phosphatase: 46 U/L (ref 45–117)
Anion gap: 8 mmol/L (ref 5–15)
BUN/Creatinine ratio: 28 — ABNORMAL HIGH (ref 12–20)
BUN: 41 MG/DL — ABNORMAL HIGH (ref 6–20)
Bilirubin, total: 0.4 MG/DL (ref 0.2–1.0)
CO2: 26 mmol/L (ref 21–32)
Calcium: 9.7 MG/DL (ref 8.5–10.1)
Chloride: 105 mmol/L (ref 97–108)
Creatinine: 1.45 MG/DL — ABNORMAL HIGH (ref 0.70–1.30)
GFR est AA: 57 mL/min/{1.73_m2} — ABNORMAL LOW (ref >60)
GFR est non-AA: 47 mL/min/{1.73_m2} — ABNORMAL LOW (ref >60)
Globulin: 4.1 g/dL — ABNORMAL HIGH (ref 2.0–4.0)
Glucose: 196 mg/dL — ABNORMAL HIGH (ref 65–100)
Potassium: 3.8 mmol/L (ref 3.5–5.1)
Protein, total: 7.9 g/dL (ref 6.4–8.2)
Sodium: 139 mmol/L (ref 136–145)

## 2019-06-16 LAB — URINALYSIS W/ REFLEX CULTURE
BACTERIA, URINE: NEGATIVE /hpf
Bacteria: NEGATIVE /[HPF]
Bilirubin, Urine: NEGATIVE
Bilirubin: NEGATIVE
Blood, Urine: NEGATIVE
Blood: NEGATIVE
Glucose, Ur: NEGATIVE mg/dL
Glucose: NEGATIVE mg/dL
Ketone: NEGATIVE mg/dL
Ketones, Urine: NEGATIVE mg/dL
Leukocyte Esterase, Urine: NEGATIVE
Leukocyte Esterase: NEGATIVE
Nitrite, Urine: NEGATIVE
Nitrites: NEGATIVE
Protein, UA: NEGATIVE mg/dL
Protein: NEGATIVE mg/dL
Specific Gravity, UA: 1.025 (ref 1.003–1.030)
Specific gravity: 1.025 (ref 1.003–1.030)
Urobilinogen, UA, POCT: 0.2 EU/dL (ref 0.2–1.0)
Urobilinogen: 0.2 U/dL (ref 0.2–1.0)
pH (UA): 5 (ref 5.0–8.0)
pH, UA: 5 (ref 5.0–8.0)

## 2019-06-16 LAB — CBC WITH AUTOMATED DIFF
ABS. BASOPHILS: 0.1 10*3/uL (ref 0.0–0.1)
ABS. EOSINOPHILS: 0.1 10*3/uL (ref 0.0–0.4)
ABS. IMM. GRANS.: 0 10*3/uL (ref 0.00–0.04)
ABS. LYMPHOCYTES: 0.7 10*3/uL — ABNORMAL LOW (ref 0.8–3.5)
ABS. MONOCYTES: 0.7 10*3/uL (ref 0.0–1.0)
ABS. NEUTROPHILS: 3.3 10*3/uL (ref 1.8–8.0)
ABSOLUTE NRBC: 0 10*3/uL (ref 0.00–0.01)
BASOPHILS: 1 % (ref 0–1)
EOSINOPHILS: 3 % (ref 0–7)
HCT: 37.3 % (ref 36.6–50.3)
HGB: 12 g/dL — ABNORMAL LOW (ref 12.1–17.0)
IMMATURE GRANULOCYTES: 0 % (ref 0.0–0.5)
LYMPHOCYTES: 15 % (ref 12–49)
MCH: 31.9 PG (ref 26.0–34.0)
MCHC: 32.2 g/dL (ref 30.0–36.5)
MCV: 99.2 FL — ABNORMAL HIGH (ref 80.0–99.0)
MONOCYTES: 14 % — ABNORMAL HIGH (ref 5–13)
MPV: 9.7 FL (ref 8.9–12.9)
NEUTROPHILS: 67 % (ref 32–75)
NRBC: 0 PER 100 WBC
PLATELET: 153 10*3/uL (ref 150–400)
RBC: 3.76 M/uL — ABNORMAL LOW (ref 4.10–5.70)
RDW: 13.2 % (ref 11.5–14.5)
WBC: 4.9 10*3/uL (ref 4.1–11.1)

## 2019-06-16 LAB — COMPREHENSIVE METABOLIC PANEL
ALT: 26 U/L (ref 12–78)
AST: 23 U/L (ref 15–37)
Albumin/Globulin Ratio: 0.9 — ABNORMAL LOW (ref 1.1–2.2)
Albumin: 3.8 g/dL (ref 3.5–5.0)
Alkaline Phosphatase: 46 U/L (ref 45–117)
Anion Gap: 8 mmol/L (ref 5–15)
BUN: 41 MG/DL — ABNORMAL HIGH (ref 6–20)
Bun/Cre Ratio: 28 — ABNORMAL HIGH (ref 12–20)
CO2: 26 mmol/L (ref 21–32)
Calcium: 9.7 MG/DL (ref 8.5–10.1)
Chloride: 105 mmol/L (ref 97–108)
Creatinine: 1.45 MG/DL — ABNORMAL HIGH (ref 0.70–1.30)
EGFR IF NonAfrican American: 47 mL/min/{1.73_m2} — ABNORMAL LOW (ref >60)
GFR African American: 57 mL/min/{1.73_m2} — ABNORMAL LOW (ref >60)
Globulin: 4.1 g/dL — ABNORMAL HIGH (ref 2.0–4.0)
Glucose: 196 mg/dL — ABNORMAL HIGH (ref 65–100)
Potassium: 3.8 mmol/L (ref 3.5–5.1)
Sodium: 139 mmol/L (ref 136–145)
Total Bilirubin: 0.4 MG/DL (ref 0.2–1.0)
Total Protein: 7.9 g/dL (ref 6.4–8.2)

## 2019-06-16 LAB — CBC WITH AUTO DIFFERENTIAL
Basophils %: 1 % (ref 0–1)
Basophils Absolute: 0.1 10*3/uL (ref 0.0–0.1)
Eosinophils %: 3 % (ref 0–7)
Eosinophils Absolute: 0.1 10*3/uL (ref 0.0–0.4)
Granulocyte Absolute Count: 0 10*3/uL (ref 0.00–0.04)
Hematocrit: 37.3 % (ref 36.6–50.3)
Hemoglobin: 12 g/dL — ABNORMAL LOW (ref 12.1–17.0)
Immature Granulocytes: 0 % (ref 0.0–0.5)
Lymphocytes %: 15 % (ref 12–49)
Lymphocytes Absolute: 0.7 10*3/uL — ABNORMAL LOW (ref 0.8–3.5)
MCH: 31.9 PG (ref 26.0–34.0)
MCHC: 32.2 g/dL (ref 30.0–36.5)
MCV: 99.2 FL — ABNORMAL HIGH (ref 80.0–99.0)
MPV: 9.7 FL (ref 8.9–12.9)
Monocytes %: 14 % — ABNORMAL HIGH (ref 5–13)
Monocytes Absolute: 0.7 10*3/uL (ref 0.0–1.0)
NRBC Absolute: 0 10*3/uL (ref 0.00–0.01)
Neutrophils %: 67 % (ref 32–75)
Neutrophils Absolute: 3.3 10*3/uL (ref 1.8–8.0)
Nucleated RBCs: 0 PER 100 WBC
Platelets: 153 10*3/uL (ref 150–400)
RBC: 3.76 M/uL — ABNORMAL LOW (ref 4.10–5.70)
RDW: 13.2 % (ref 11.5–14.5)
WBC: 4.9 10*3/uL (ref 4.1–11.1)

## 2019-06-16 MED ORDER — SODIUM CHLORIDE 0.9% BOLUS IV
0.9 % | INTRAVENOUS | Status: AC
Start: 2019-06-16 — End: 2019-06-16
  Administered 2019-06-16: 21:00:00 via INTRAVENOUS

## 2019-06-16 MED ORDER — SODIUM CHLORIDE 0.9 % IJ SYRG
Freq: Once | INTRAMUSCULAR | Status: AC
Start: 2019-06-16 — End: 2019-06-16
  Administered 2019-06-16: 22:00:00 via INTRAVENOUS

## 2019-06-16 MED ORDER — SODIUM CHLORIDE 0.9% BOLUS IV
0.9 % | Freq: Once | INTRAVENOUS | Status: AC
Start: 2019-06-16 — End: 2019-06-16
  Administered 2019-06-16: 22:00:00 via INTRAVENOUS

## 2019-06-16 MED ORDER — IOPAMIDOL 76 % IV SOLN
76 % | Freq: Once | INTRAVENOUS | Status: AC
Start: 2019-06-16 — End: 2019-06-16
  Administered 2019-06-16: 22:00:00 via INTRAVENOUS

## 2019-06-16 MED FILL — NORMAL SALINE FLUSH 0.9 % INJECTION SYRINGE: INTRAMUSCULAR | Qty: 10

## 2019-06-16 MED FILL — SODIUM CHLORIDE 0.9 % IV: INTRAVENOUS | Qty: 1000

## 2019-06-16 NOTE — ED Provider Notes (Signed)
HPI the patient has a 3-day history of body ache and fatigue.  At the onset he had a frontal headache but that has resolved.  He denies having fever, shortness of breath, cough, urinary symptoms, chest pain or abdominal pain.    Past Medical History:   Diagnosis Date   ??? 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   ??? Diabetes ([place])    ??? History of prostate cancer 10/19/2011    Diagnosis 9/11   ??? Hypothyroid 10/23/2013   ??? Microalbuminuria    ??? Osteoarthritis 02/03/2009   ??? Prostate CA ([place]) 10/19/2011   ??? Unspecified essential hypertension 06/23/2008       Past Surgical History:   Procedure Laterality Date   ??? ENDOSCOPY, COLON, DIAGNOSTIC     ??? HX HERNIA REPAIR      bilateral   ??? 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 Nelva Bush    ??? HX ORTHOPAEDIC  06/2007    lumbar laminectomy   ??? HX ORTHOPAEDIC      right shoulder surgery         Family History:   Problem Relation Age of Onset   ??? Cancer Mother         breast   ??? Diabetes Brother    ??? Arthritis-rheumatoid Father    ??? Arthritis-rheumatoid Sister        Social History     Socioeconomic History   ??? Marital status: MARRIED     Spouse name: Not on file   ??? Number of children: Not on file   ??? Years of education: Not on file   ??? Highest education level: Not on file   Occupational History   ??? Not on file   Social Needs   ??? Financial resource strain: Not on file   ??? Food insecurity     Worry: Not on file     Inability: Not on file   ??? Transportation needs     Medical: Not on file     Non-medical: Not on file   Tobacco Use   ??? Smoking status: Former Smoker     Years: 40.00     Last attempt to quit: 09/04/2002     Years since quitting: 16.7   ??? Smokeless tobacco: Never Used   Substance and Sexual Activity   ??? Alcohol use: Yes     Alcohol/week: 0.8 standard drinks     Types: 1 Cans of beer per week   ??? Drug use: No   ??? Sexual activity: Never   Lifestyle   ??? Physical activity     Days per week: Not on file     Minutes per session: Not on file   ??? Stress: Not on  file   Relationships   ??? Social Product manager on phone: Not on file     Gets together: Not on file     Attends religious service: Not on file     Active member of club or organization: Not on file     Attends meetings of clubs or organizations: Not on file     Relationship status: Not on file   ??? Intimate partner violence     Fear of current or ex partner: Not on file     Emotionally abused: Not on file     Physically abused: Not on file     Forced sexual activity: Not on file  Other Topics Concern   ??? Not on file   Social History Narrative   ??? Not on file         ALLERGIES: Chlorhexidine; Levofloxacin; and Tape [adhesive]    Review of Systems   Constitutional: Positive for fatigue. Negative for fever.   HENT: Negative for voice change.    Eyes: Negative for pain.   Respiratory: Negative for cough and shortness of breath.    Cardiovascular: Negative for chest pain.   Gastrointestinal: Negative for abdominal pain, nausea and vomiting.   Genitourinary: Negative for flank pain.   Musculoskeletal: Positive for myalgias. Negative for back pain.   Skin: Negative for rash.   Neurological: Negative for headaches.   Psychiatric/Behavioral: Negative for confusion.       Vitals:    06/16/19 1448   BP: 119/71   Pulse: 92   Resp: 18   Temp: 97.5 ??F (36.4 ??C)   SpO2: 96%   Weight: 96.2 kg (212 lb)   Height: 6\' 1"  (1.854 m)            Physical Exam  Constitutional:       General: He is not in acute distress.     Appearance: He is well-developed.   HENT:      Head: Normocephalic.   Eyes:      Pupils: Pupils are equal, round, and reactive to light.   Neck:      Musculoskeletal: Normal range of motion and neck supple.   Cardiovascular:      Rate and Rhythm: Normal rate.      Heart sounds: No murmur.   Pulmonary:      Effort: Pulmonary effort is normal.      Breath sounds: Normal breath sounds.   Abdominal:      Palpations: Abdomen is soft.      Tenderness: There is abdominal tenderness.      Comments: Mild tenderness to the  suprapubic area   Musculoskeletal: Normal range of motion.   Skin:     General: Skin is warm and dry.      Capillary Refill: Capillary refill takes less than 2 seconds.   Neurological:      Mental Status: He is alert.   Psychiatric:         Behavior: Behavior normal.          MDM       Procedures    The patient's wife says that for the past few years the patient has had slowly worsening renal function and his BUN/creatinine are being followed by their nephrologist.

## 2019-06-16 NOTE — ED Notes (Signed)
Discharge instructions given to patient by MD and RN. Pt has been given counseling on medication use and verbalizes understanding. IV D/C. Pt ambulated off of unit in no signs of distress.

## 2019-06-16 NOTE — ED Notes (Signed)
Pt reports body aches and fatigue x3 days. Pt denies fever, SOB and cough.

## 2019-06-16 NOTE — ED Provider Notes (Addendum)
HPI the patient has a 3-day history of body ache and fatigue.  At the onset he had a frontal headache but that has resolved.  He denies having fever, shortness of breath, cough, urinary symptoms, chest pain or abdominal pain.    Past Medical History:   Diagnosis Date   ??? 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   ??? Diabetes ([place])    ??? History of prostate cancer 10/19/2011    Diagnosis 9/11   ??? Hypothyroid 10/23/2013   ??? Microalbuminuria    ??? Osteoarthritis 02/03/2009   ??? Prostate CA ([place]) 10/19/2011   ??? Unspecified essential hypertension 06/23/2008       Past Surgical History:   Procedure Laterality Date   ??? ENDOSCOPY, COLON, DIAGNOSTIC     ??? HX HERNIA REPAIR      bilateral   ??? 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 Nelva Bush    ??? HX ORTHOPAEDIC  06/2007    lumbar laminectomy   ??? HX ORTHOPAEDIC      right shoulder surgery         Family History:   Problem Relation Age of Onset   ??? Cancer Mother         breast   ??? Diabetes Brother    ??? Arthritis-rheumatoid Father    ??? Arthritis-rheumatoid Sister        Social History     Socioeconomic History   ??? Marital status: MARRIED     Spouse name: Not on file   ??? Number of children: Not on file   ??? Years of education: Not on file   ??? Highest education level: Not on file   Occupational History   ??? Not on file   Social Needs   ??? Financial resource strain: Not on file   ??? Food insecurity     Worry: Not on file     Inability: Not on file   ??? Transportation needs     Medical: Not on file     Non-medical: Not on file   Tobacco Use   ??? Smoking status: Former Smoker     Years: 40.00     Last attempt to quit: 09/04/2002     Years since quitting: 16.7   ??? Smokeless tobacco: Never Used   Substance and Sexual Activity   ??? Alcohol use: Yes     Alcohol/week: 0.8 standard drinks     Types: 1 Cans of beer per week   ??? Drug use: No   ??? Sexual activity: Never   Lifestyle   ??? Physical activity     Days per week: Not on file     Minutes per session: Not on file    ??? Stress: Not on file   Relationships   ??? Social Product manager on phone: Not on file     Gets together: Not on file     Attends religious service: Not on file     Active member of club or organization: Not on file     Attends meetings of clubs or organizations: Not on file     Relationship status: Not on file   ??? Intimate partner violence     Fear of current or ex partner: Not on file     Emotionally abused: Not on file     Physically abused: Not on file     Forced sexual activity: Not on file  Other Topics Concern   ??? Not on file   Social History Narrative   ??? Not on file         ALLERGIES: Chlorhexidine; Levofloxacin; and Tape [adhesive]    Review of Systems   Constitutional: Positive for fatigue. Negative for fever.   HENT: Negative for voice change.    Eyes: Negative for pain.   Respiratory: Negative for cough and shortness of breath.    Cardiovascular: Negative for chest pain.   Gastrointestinal: Negative for abdominal pain, nausea and vomiting.   Genitourinary: Negative for flank pain.   Musculoskeletal: Positive for myalgias. Negative for back pain.   Skin: Negative for rash.   Neurological: Negative for headaches.   Psychiatric/Behavioral: Negative for confusion.       Vitals:    06/16/19 1448   BP: 119/71   Pulse: 92   Resp: 18   Temp: 97.5 ??F (36.4 ??C)   SpO2: 96%   Weight: 96.2 kg (212 lb)   Height: 6\' 1"  (1.854 m)            Physical Exam  Constitutional:       General: He is not in acute distress.     Appearance: He is well-developed.   HENT:      Head: Normocephalic.   Eyes:      Pupils: Pupils are equal, round, and reactive to light.   Neck:      Musculoskeletal: Normal range of motion and neck supple.   Cardiovascular:      Rate and Rhythm: Normal rate.      Heart sounds: No murmur.   Pulmonary:      Effort: Pulmonary effort is normal.      Breath sounds: Normal breath sounds.   Abdominal:      Palpations: Abdomen is soft.      Tenderness: There is abdominal tenderness.       Comments: Mild tenderness to the suprapubic area   Musculoskeletal: Normal range of motion.   Skin:     General: Skin is warm and dry.      Capillary Refill: Capillary refill takes less than 2 seconds.   Neurological:      Mental Status: He is alert.   Psychiatric:         Behavior: Behavior normal.          MDM       Procedures    The patient's wife says that for the past few years the patient has had slowly worsening renal function and his BUN/creatinine are being followed by their nephrologist.

## 2019-06-16 NOTE — ED Triage Notes (Signed)
Pt reports body aches and fatigue x3 days. Pt denies fever, SOB and cough.

## 2019-06-17 ENCOUNTER — Ambulatory Visit: Payer: MEDICARE | Attending: Family | Primary: Internal Medicine

## 2019-06-17 NOTE — Progress Notes (Signed)
Patient contacted regarding COVID-19  risk. Discussed COVID-19 related testing which was pending at this time. Test results were pending. Patient informed of results, if available? yes. Outreach made within 2 business days of discharge: Yes    Care Transition Nurse/ Ambulatory Care Manager/ LPN Care Coordinator contacted the patient by telephone to perform post discharge assessment. Verified name and DOB with patient as identifiers. Provided introduction to self, and explanation of the CTN/ACM/LPN role, and reason for call due to risk factors for infection and/or exposure to COVID-19.     Symptoms reviewed with patient who verbalized the following symptoms: no new symptoms and no worsening symptoms.      Due to no new or worsening symptoms encounter was not routed to provider for escalation. Discussed follow-up appointments. If no appointment was previously scheduled, appointment scheduling offered: no  [facility] follow up appointment(s):   Future Appointments   Date Time Provider Department Center   06/27/2019  2:00 PM Johnsie Kindred, MD [facility]     Non-[facility] follow up appointment(s): none      Advance Care Planning:   Does patient have an Advance Directive: will address during future CCM call    Patient has following risk factors of: diabetes. CTN/ACM/LPN reviewed discharge instructions, medical action plan and red flags such as increased shortness of breath, increasing fever and signs of decompensation with patient who verbalized understanding.   Discussed exposure protocols and quarantine with CDC Guidelines "What to do if you are sick with coronavirus disease 2019." Patient was given an opportunity for questions and concerns. The patient agrees to contact the [facility] exposure line [phone], local health department [facility]  [phone]) and PCP office for questions related to their healthcare. CTN/ACM provided contact information for future needs.    Reviewed and educated patient on  any new and changed medications related to discharge diagnosis.    Patient/family/caregiver given information for [employer] and agrees to enroll yes  Patient's preferred e-mail:  [email or web address]  Patient's preferred phone number: [phone]  Based on Loop alert triggers, patient will be contacted by nurse care manager for worsening symptoms.    Pt will be further monitored by COVID Loop Team based on severity of symptoms and risk factors. Also enrolling patient in CCM services.

## 2019-06-17 NOTE — Telephone Encounter (Signed)
Patient was told at [facility] last night to f/u with his doctor, but is waiting on the result of a covid test.  Patient has a [place] scheduled for next Fri 10/23.  Please advise what to do.      Or  Cell# [phone]

## 2019-06-17 NOTE — Progress Notes (Signed)
Patient contacted regarding COVID-19  risk. Discussed COVID-19 related testing which was pending at this time. Test results were pending. Patient informed of results, if available? yes. Outreach made within 2 business days of discharge: Yes    Care Transition Nurse/ Ambulatory Care Manager/ LPN Care Coordinator contacted the patient by telephone to perform post discharge assessment. Verified name and DOB with patient as identifiers. Provided introduction to self, and explanation of the CTN/ACM/LPN role, and reason for call due to risk factors for infection and/or exposure to COVID-19.     Symptoms reviewed with patient who verbalized the following symptoms: no new symptoms and no worsening symptoms.      Due to no new or worsening symptoms encounter was not routed to provider for escalation. Discussed follow-up appointments. If no appointment was previously scheduled, appointment scheduling offered: no  [place] follow up appointment(s):   Future Appointments   Date Time Provider [place]   06/27/2019  2:00 PM Gloriajean Dell, MD [facility] AMB     Non-[facility] follow up appointment(s): none      Advance Care Planning:   Does patient have an Advance Directive: will address during future CCM call    Patient has following risk factors of: diabetes. CTN/ACM/LPN reviewed discharge instructions, medical action plan and red flags such as increased shortness of breath, increasing fever and signs of decompensation with patient who verbalized understanding.   Discussed exposure protocols and quarantine with CDC Guidelines ???What to do if you are sick with coronavirus disease 2019.??? Patient was given an opportunity for questions and concerns. The patient agrees to contact the [facility] exposure line [phone], local health department [place] [facility]  [phone]) and PCP office for questions related to their healthcare. CTN/ACM provided contact information for future needs.     Reviewed and educated patient on any new and changed medications related to discharge diagnosis.    Patient/family/caregiver given information for [employer] and agrees to enroll yes  Patient's preferred e-mail:  [email or web address]  Patient's preferred phone number: [phone]  Based on Loop alert triggers, patient will be contacted by nurse care manager for worsening symptoms.    Pt will be further monitored by COVID Loop Team?? based on severity of symptoms and risk factors. Also enrolling patient in CCM services.

## 2019-06-18 LAB — SARS-COV-2: COVID-19: NOT DETECTED

## 2019-06-18 LAB — COVID-19: Covid-19: NOT DETECTED

## 2019-06-18 NOTE — Telephone Encounter (Signed)
Verified patient identity with two identifiers. Spoke with patient by phone.  Pt states he is "feeling alright" today.    COVID test is still pending, they will call him with results.    Once know results of test will determine next step for appt next week. Patient verbalized understanding.

## 2019-06-25 NOTE — Progress Notes (Signed)
 Ambulatory Care Management Note    Date/Time:  06/27/2019 1:01 PM    This Ambulatory Care Manager (ACM) reviewed and updated the following screenings during this call; general assessment.     Patient's challenges to self management identified:   no challenges identified      Medication Management:  good adherence and good understanding    Advance Care Planning:   Does patient have an Advance Directive:  not on file; will address during future outreach    [employer], Referrals, and Durable Medical Equipment: none      Health Maintenance Due   Topic Date Due   . Shingrix Vaccine Age 41> (1 of 2) 09/15/1992   . A1C test (Diabetic or Prediabetic)  02/26/2019   . Flu Vaccine (1) 05/06/2019   . Foot Exam Q1  06/21/2019   . Medicare Yearly Exam  06/21/2019   . GLAUCOMA SCREENING Q2Y  07/05/2019   . Eye Exam Retinal or Dilated  07/05/2019     Health Maintenance reviewed - patient asked to schedule diabetic eye exam, updated flu shot record (patient received in September), also advised due for A1C.    ACM will follow up with patient to discuss goals and establish care plan in the next 7-14 days.       PCP/Specialist follow up:   Future Appointments   Date Time Provider Department Center   06/27/2019  2:00 PM Teresa Charleen BRAVO, MD [facility] BS AMB

## 2019-06-25 NOTE — Progress Notes (Signed)
Ambulatory Care Management Note    Date/Time:  06/27/2019 1:01 PM    This Ambulatory Care Manager (ACM) reviewed and updated the following screenings during this call; general assessment.     Patient's challenges to self management identified:   no challenges identified      Medication Management:  good adherence and good understanding    Advance Care Planning:   Does patient have an Advance Directive:  not on file; will address during future outreach    [employer], Referrals, and Durable Medical Equipment: none      Health Maintenance Due   Topic Date Due   ??? Shingrix Vaccine Age 21> (1 of 2) 09/15/1992   ??? A1C test (Diabetic or Prediabetic)  02/26/2019   ??? Flu Vaccine (1) 05/06/2019   ??? Foot Exam Q1  06/21/2019   ??? Medicare Yearly Exam  06/21/2019   ??? GLAUCOMA SCREENING Q2Y  07/05/2019   ??? Eye Exam Retinal or Dilated  07/05/2019     Health Maintenance reviewed - patient asked to schedule diabetic eye exam, updated flu shot record (patient received in September), also advised due for A1C.    ACM will follow up with patient to discuss goals and establish care plan in the next 7-14 days.       PCP/Specialist follow up:   Future Appointments   Date Time Provider [place]   06/27/2019  2:00 PM Gloriajean Dell, MD [facility] BS AMB

## 2019-06-27 ENCOUNTER — Ambulatory Visit: Attending: Internal Medicine | Primary: Internal Medicine

## 2019-06-27 ENCOUNTER — Ambulatory Visit: Admit: 2019-06-27 | Payer: MEDICARE | Attending: Internal Medicine | Primary: Internal Medicine

## 2019-06-27 DIAGNOSIS — Z0000 Encounter for general adult medical examination without abnormal findings: Secondary | ICD-10-CM

## 2019-06-27 DIAGNOSIS — E1129 Type 2 diabetes mellitus with other diabetic kidney complication: Secondary | ICD-10-CM

## 2019-06-27 NOTE — Progress Notes (Signed)
This is the Subsequent Medicare Annual Wellness Exam, performed 12 months or more after the Initial AWV or the last Subsequent AWV    I have reviewed the patient's medical history in detail and updated the computerized patient record.     History     Patient Active Problem List   Diagnosis Code   ??? 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 History of prostate cancer Z85.46   ??? History of head and neck cancer Z85.89   ??? Hypothyroid E03.9   ??? Advanced care planning/counseling discussion Z71.89   ??? Controlled type 2 diabetes mellitus with microalbuminuria, without long-term current use of insulin (HCC) E11.29, R80.9     Past Medical History:   Diagnosis Date   ??? Cancer ([place]) 03/2009    throat cancer.   ??? Diabetes ([place])    ??? History of prostate cancer 10/19/2011    Diagnosis 9/11   ??? Hypothyroid 10/23/2013   ??? Microalbuminuria    ??? Osteoarthritis 02/03/2009   ??? Prostate CA ([place]) 10/19/2011   ??? Unspecified essential hypertension 06/23/2008      Past Surgical History:   Procedure Laterality Date   ??? ENDOSCOPY, COLON, DIAGNOSTIC     ??? HX HERNIA REPAIR      bilateral   ??? 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 Nelva Bush    ??? HX ORTHOPAEDIC  06/2007    lumbar laminectomy   ??? HX ORTHOPAEDIC      right shoulder surgery     Current Outpatient Medications   Medication Sig Dispense Refill   ??? levothyroxine (SYNTHROID) 75 mcg tablet Take  by mouth Daily (before breakfast).     ??? lisinopril (PRINIVIL, ZESTRIL) 5 mg tablet Take  by mouth daily.     ??? cholecalciferol (VITAMIN D3) 1,000 unit tablet Take  by mouth daily.     ??? aspirin delayed-release 81 mg tablet Take 1 Tab by mouth daily. 30 Tab 11   ??? glimepiride (AMARYL) 4 mg tablet Take  by mouth two (2) times a day.     ??? FERROUS FUMARATE/VIT BCOMP&C (SUPER B COMPLEX PO) Take  by mouth.       ??? glucose blood VI test strips (ASCENSIA AUTODISC VI, ONE TOUCH ULTRA TEST VI) strip 1 Each by Does Not Apply route. Pt testing once daily. 150 Strip 3   ???  metformin ER (GLUCOPHAGE XR) 500 mg tablet Take 500 mg by mouth two (2) times a day. 2 qpm  Indications: TYPE 2 DIABETES MELLITUS     ??? ascorbic acid (VITAMIN C) 1,000 mg tablet Take  by mouth.     ??? Saliva Stimulant Agents Comb.3 (BIOTENE MOISTURIZING MOUTH) Spry 1 Spray by Mucous Membrane route once as needed.       Allergies   Allergen Reactions   ??? Chlorhexidine Contact Dermatitis   ??? Levofloxacin Swelling   ??? Tape [Adhesive] Contact Dermatitis     Clear surgical tape over port.       Family History   Problem Relation Age of Onset   ??? Cancer Mother         breast   ??? Diabetes Brother    ??? Arthritis-rheumatoid Father    ??? Arthritis-rheumatoid Sister      Social History     Tobacco Use   ??? Smoking status: Former Smoker     Years: 40.00     Last attempt to quit: 09/04/2002  Years since quitting: 16.8   ??? Smokeless tobacco: Never Used   Substance Use Topics   ??? Alcohol use: Yes     Alcohol/week: 0.8 standard drinks     Types: 1 Cans of beer per week       Depression Risk Factor Screening:     3 most recent PHQ Screens 06/27/2019   PHQ Not Done -   Little interest or pleasure in doing things Not at all   Feeling down, depressed, irritable, or hopeless Not at all   Total Score PHQ 2 0       Alcohol Risk Screen   Do you average more than 1 drink per night or more than 7 drinks a week: No    In the past three months have you have had more than 4 drinks containing alcohol on one occasion: No        Functional Ability and Level of Safety:   Hearing: decreased right ear     Activities of Daily Living:  The home contains: no safety equipment.  Patient does total self care     Ambulation: with no difficulty     Fall Risk:  Fall Risk Assessment, last 12 mths 06/27/2019   Able to walk? Yes   Fall in past 12 months? No   Fall with injury? -   Number of falls in past 12 months -   Fall Risk Score -     Abuse Screen:  Patient is not abused       Cognitive Screening   Has your family/caregiver stated any concerns about your  memory: no     Cognitive Screening: normal    Patient Care Team   Patient Care Team:  Gloriajean Dell, MD as PCP - General  Dema Severin Lendon Collar, MD as PCP - [facility] Empaneled Provider  Guadalupe Maple Fanny Bien, MD (Hematology and Oncology)  Delisio, Elwyn Lade, MD (Urology)  Matilde Haymaker, MD (Otolaryngology)  Puttkammer, Sandi Mariscal, MD (Internal Medicine)  Rikki Spearing, [place] (Podiatry)  Apolinar Junes, RN as Ambulatory Care Manager    Assessment/Plan   Education and counseling provided:  Are appropriate based on today's review and evaluation    Diagnoses and all orders for this visit:    1. Medicare annual wellness visit, subsequent  Health maintenance reviewed and updated with patient today at visit.    2. Screening for alcoholism    3. Controlled type 2 diabetes mellitus with microalbuminuria, without long-term current use of insulin (HCC)  A1c has been at goal.  Will obtain most recent lab work and note from endocrinology.  Continue with diabetic diet and current medications. Also seeing podiatry and eye specialist and will request records.  4. Acquired hypothyroidism  Continue current medications.  Will obtain lab work for review.  5. History of prostate cancer  Closely monitored by urology.  States recently had his lowest PSA.  6. History of head and neck cancer  Monitored by Dr. Guadalupe Maple and oncology.       Health Maintenance Due   Topic Date Due   ??? Shingrix Vaccine Age 21> (1 of 2) 09/15/1992   ??? A1C test (Diabetic or Prediabetic)  02/26/2019   ??? Flu Vaccine (1) 05/06/2019   ??? Foot Exam Q1  06/21/2019   ??? Medicare Yearly Exam  06/21/2019   ??? GLAUCOMA SCREENING Q2Y  07/05/2019   ??? Eye Exam Retinal or Dilated  07/05/2019     Stanley Holt is  a 78 y.o. male who was seen today for a follow-up visit.    AODM, hyperlipidemia and history of prostate cancer and hypothyroid.   1. Cardiovascular: He reports taking medications as instructed, no medication side effects noted, The home BP readings outside of office:  not checking. . Diet and Lifestyle: generally follows a low fat low cholesterol diet, generally follows a low sodium diet. Exercise: walking ????  2. AODM with microalbuminuria; ??Since last visit: reviewed last labs and is followed by Dr Gari Crown endocrine. Last lab work requested.  Home glucose monitoring: 120-130's  He reports medication compliance: compliant all of the time. He reports the following medication side effects: none. Diabetic diet compliance: compliant most of the time.   3. Hypothyroid: Thyroid ROS: denies fatigue, weight changes, heat/cold intolerance, bowel/skin changes or CVS symptoms.   4. History of prostate cancer and followed by urology. No recurrence and last PSA not detected per pt.   5. Hx or head and neck cancer and followed by oncology Dr Guadalupe Maple. 10 yrs and no recurrence.       Review of Systems   Constitutional: Negative for malaise/fatigue.   Respiratory: Negative for shortness of breath.    Cardiovascular: Negative for chest pain and leg swelling.   Gastrointestinal: Negative for abdominal pain and heartburn.   Genitourinary: Negative for dysuria.   Neurological: Positive for headaches (ocassional). Negative for dizziness.   Psychiatric/Behavioral: Negative for depression.        Physical Exam  Vitals signs and nursing note reviewed.   Constitutional:       General: He is not in acute distress.     Appearance: He is well-developed.   HENT:      Head: Normocephalic and atraumatic.      Nose: Nose normal.      Mouth/Throat:      Pharynx: Oropharynx is clear.   Neck:      Thyroid: No thyromegaly.      Vascular: No carotid bruit.   Cardiovascular:      Rate and Rhythm: Normal rate and regular rhythm.   Pulmonary:      Effort: Pulmonary effort is normal.      Breath sounds: Normal breath sounds. No wheezing.   Abdominal:      General: Bowel sounds are normal.      Palpations: Abdomen is soft.      Tenderness: There is no abdominal tenderness.   Musculoskeletal:      Right lower leg: Edema  (trace) present.      Left lower leg: Edema (trace) present.   Psychiatric:         Mood and Affect: Mood normal.         Behavior: Behavior normal.           Aspects of this note may have been generated using voice recognition software. Despite editing, there may be some syntax errors   I have discussed the diagnosis with the patient and the intended plan as seen in the above orders.  The patient has received an after-visit summary and questions were answered concerning future plans.  I have discussed any recommended medication side effects and warnings with the patient as well.  He has expressed understanding of the diagnosis and plan    Gloriajean Dell, MD

## 2019-06-27 NOTE — ACP (Advance Care Planning) (Signed)
Reviewed importance of ACP and healthcare POA and he declines today. States his family knows his wishes.

## 2019-06-27 NOTE — Progress Notes (Signed)
This is the Subsequent Medicare Annual Wellness Exam, performed 12 months or more after the Initial AWV or the last Subsequent AWV    I have reviewed the patient's medical history in detail and updated the computerized patient record.     History     Patient Active Problem List   Diagnosis Code   ??? 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 History of prostate cancer Z85.46   ??? History of head and neck cancer Z85.89   ??? Hypothyroid E03.9   ??? Advanced care planning/counseling discussion Z71.89   ??? Controlled type 2 diabetes mellitus with microalbuminuria, without long-term current use of insulin (HCC) E11.29, R80.9     Past Medical History:   Diagnosis Date   ??? Cancer ([place]) 03/2009    throat cancer.   ??? Diabetes ([place])    ??? History of prostate cancer 10/19/2011    Diagnosis 9/11   ??? Hypothyroid 10/23/2013   ??? Microalbuminuria    ??? Osteoarthritis 02/03/2009   ??? Prostate CA ([place]) 10/19/2011   ??? Unspecified essential hypertension 06/23/2008      Past Surgical History:   Procedure Laterality Date   ??? ENDOSCOPY, COLON, DIAGNOSTIC     ??? HX HERNIA REPAIR      bilateral   ??? 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 Nelva Bush    ??? HX ORTHOPAEDIC  06/2007    lumbar laminectomy   ??? HX ORTHOPAEDIC      right shoulder surgery     Current Outpatient Medications   Medication Sig Dispense Refill   ??? levothyroxine (SYNTHROID) 75 mcg tablet Take  by mouth Daily (before breakfast).     ??? lisinopril (PRINIVIL, ZESTRIL) 5 mg tablet Take  by mouth daily.     ??? cholecalciferol (VITAMIN D3) 1,000 unit tablet Take  by mouth daily.     ??? aspirin delayed-release 81 mg tablet Take 1 Tab by mouth daily. 30 Tab 11   ??? glimepiride (AMARYL) 4 mg tablet Take  by mouth two (2) times a day.     ??? FERROUS FUMARATE/VIT BCOMP&C (SUPER B COMPLEX PO) Take  by mouth.       ??? glucose blood VI test strips (ASCENSIA AUTODISC VI, ONE TOUCH ULTRA TEST VI) strip 1 Each by Does Not Apply route. Pt testing once daily. 150 Strip 3    ??? metformin ER (GLUCOPHAGE XR) 500 mg tablet Take 500 mg by mouth two (2) times a day. 2 qpm  Indications: TYPE 2 DIABETES MELLITUS     ??? ascorbic acid (VITAMIN C) 1,000 mg tablet Take  by mouth.     ??? Saliva Stimulant Agents Comb.3 (BIOTENE MOISTURIZING MOUTH) Spry 1 Spray by Mucous Membrane route once as needed.       Allergies   Allergen Reactions   ??? Chlorhexidine Contact Dermatitis   ??? Levofloxacin Swelling   ??? Tape [Adhesive] Contact Dermatitis     Clear surgical tape over port.       Family History   Problem Relation Age of Onset   ??? Cancer Mother         breast   ??? Diabetes Brother    ??? Arthritis-rheumatoid Father    ??? Arthritis-rheumatoid Sister      Social History     Tobacco Use   ??? Smoking status: Former Smoker     Years: 40.00     Last attempt to quit: 09/04/2002  Years since quitting: 16.8   ??? Smokeless tobacco: Never Used   Substance Use Topics   ??? Alcohol use: Yes     Alcohol/week: 0.8 standard drinks     Types: 1 Cans of beer per week       Depression Risk Factor Screening:     3 most recent PHQ Screens 06/27/2019   PHQ Not Done -   Little interest or pleasure in doing things Not at all   Feeling down, depressed, irritable, or hopeless Not at all   Total Score PHQ 2 0       Alcohol Risk Screen   Do you average more than 1 drink per night or more than 7 drinks a week: No    In the past three months have you have had more than 4 drinks containing alcohol on one occasion: No        Functional Ability and Level of Safety:   Hearing: decreased right ear     Activities of Daily Living:  The home contains: no safety equipment.  Patient does total self care     Ambulation: with no difficulty     Fall Risk:  Fall Risk Assessment, last 12 mths 06/27/2019   Able to walk? Yes   Fall in past 12 months? No   Fall with injury? -   Number of falls in past 12 months -   Fall Risk Score -     Abuse Screen:  Patient is not abused       Cognitive Screening    Has your family/caregiver stated any concerns about your memory: no     Cognitive Screening: normal    Patient Care Team   Patient Care Team:  Gloriajean Dell, MD as PCP - General  Dema Severin Lendon Collar, MD as PCP - [facility] Empaneled Provider  Guadalupe Maple Fanny Bien, MD (Hematology and Oncology)  Delisio, Elwyn Lade, MD (Urology)  Matilde Haymaker, MD (Otolaryngology)  Puttkammer, Sandi Mariscal, MD (Internal Medicine)  Rikki Spearing, [place] (Podiatry)  Apolinar Junes, RN as Ambulatory Care Manager    Assessment/Plan   Education and counseling provided:  Are appropriate based on today's review and evaluation    Diagnoses and all orders for this visit:    1. Medicare annual wellness visit, subsequent  Health maintenance reviewed and updated with patient today at visit.    2. Screening for alcoholism    3. Controlled type 2 diabetes mellitus with microalbuminuria, without long-term current use of insulin (HCC)  A1c has been at goal.  Will obtain most recent lab work and note from endocrinology.  Continue with diabetic diet and current medications. Also seeing podiatry and eye specialist and will request records.  4. Acquired hypothyroidism  Continue current medications.  Will obtain lab work for review.  5. History of prostate cancer  Closely monitored by urology.  States recently had his lowest PSA.  6. History of head and neck cancer  Monitored by Dr. Guadalupe Maple and oncology.       Health Maintenance Due   Topic Date Due   ??? Shingrix Vaccine Age 24> (1 of 2) 09/15/1992   ??? A1C test (Diabetic or Prediabetic)  02/26/2019   ??? Flu Vaccine (1) 05/06/2019   ??? Foot Exam Q1  06/21/2019   ??? Medicare Yearly Exam  06/21/2019   ??? GLAUCOMA SCREENING Q2Y  07/05/2019   ??? Eye Exam Retinal or Dilated  07/05/2019     Stanley Holt is  a 78 y.o. male who was seen today for a follow-up visit.    AODM, hyperlipidemia and history of prostate cancer and hypothyroid.   1. Cardiovascular: He reports taking medications as instructed, no  medication side effects noted, The home BP readings outside of office: not checking. . Diet and Lifestyle: generally follows a low fat low cholesterol diet, generally follows a low sodium diet. Exercise: walking ????  2. AODM with microalbuminuria; ??Since last visit: reviewed last labs and is followed by Dr Gari Crown endocrine. Last lab work requested.  Home glucose monitoring: 120-130's  He reports medication compliance: compliant all of the time. He reports the following medication side effects: none. Diabetic diet compliance: compliant most of the time.   3. Hypothyroid: Thyroid ROS: denies fatigue, weight changes, heat/cold intolerance, bowel/skin changes or CVS symptoms.   4. History of prostate cancer and followed by urology. No recurrence and last PSA not detected per pt.   5. Hx or head and neck cancer and followed by oncology Dr Guadalupe Maple. 10 yrs and no recurrence.       Review of Systems   Constitutional: Negative for malaise/fatigue.   Respiratory: Negative for shortness of breath.    Cardiovascular: Negative for chest pain and leg swelling.   Gastrointestinal: Negative for abdominal pain and heartburn.   Genitourinary: Negative for dysuria.   Neurological: Positive for headaches (ocassional). Negative for dizziness.   Psychiatric/Behavioral: Negative for depression.        Physical Exam  Vitals signs and nursing note reviewed.   Constitutional:       General: He is not in acute distress.     Appearance: He is well-developed.   HENT:      Head: Normocephalic and atraumatic.      Nose: Nose normal.      Mouth/Throat:      Pharynx: Oropharynx is clear.   Neck:      Thyroid: No thyromegaly.      Vascular: No carotid bruit.   Cardiovascular:      Rate and Rhythm: Normal rate and regular rhythm.   Pulmonary:      Effort: Pulmonary effort is normal.      Breath sounds: Normal breath sounds. No wheezing.   Abdominal:      General: Bowel sounds are normal.      Palpations: Abdomen is soft.       Tenderness: There is no abdominal tenderness.   Musculoskeletal:      Right lower leg: Edema (trace) present.      Left lower leg: Edema (trace) present.   Psychiatric:         Mood and Affect: Mood normal.         Behavior: Behavior normal.           Aspects of this note may have been generated using voice recognition software. Despite editing, there may be some syntax errors   I have discussed the diagnosis with the patient and the intended plan as seen in the above orders.  The patient has received an after-visit summary and questions were answered concerning future plans.  I have discussed any recommended medication side effects and warnings with the patient as well.  He has expressed understanding of the diagnosis and plan    Gloriajean Dell, MD

## 2019-06-27 NOTE — Patient Instructions (Addendum)
Medicare Wellness Visit, Male    The best way to live healthy is to have a lifestyle where you eat a well-balanced diet, exercise regularly, limit alcohol use, and quit all forms of tobacco/nicotine, if applicable.     Regular preventive services are another way to keep healthy. Preventive services (vaccines, screening tests, monitoring & exams) can help personalize your care plan, which helps you manage your own care. Screening tests can find health problems at the earliest stages, when they are easiest to treat.   [facility] follows the current, evidence-based guidelines published by the [place] [employer] (USPSTF) when recommending preventive services for our patients. Because we follow these guidelines, sometimes recommendations change over time as research supports it. (For example, a prostate screening blood test is no longer routinely recommended for men with no symptoms).  Of course, you and your doctor may decide to screen more often for some diseases, based on your risk and co-morbidities (chronic disease you are already diagnosed with).     Preventive services for you include:  - Medicare offers their members a free annual wellness visit, which is time for you and your primary care provider to discuss and plan for your preventive service needs. Take advantage of this benefit every year!  -All adults over age 26 should receive the recommended pneumonia vaccines. Current USPSTF guidelines recommend a series of two vaccines for the best pneumonia protection.   -All adults should have a flu vaccine yearly and tetanus vaccine every 10 years.  -All adults age 39 and older should receive the shingles vaccines (series of two vaccines).       -All adults age 22-70 who are overweight should have a diabetes screening test once every three years.   -Other screening tests & preventive services for persons with diabetes  include: an eye exam to screen for diabetic retinopathy, a kidney function test, a foot exam, and stricter control over your cholesterol.   -Cardiovascular screening for adults with routine risk involves an electrocardiogram (ECG) at intervals determined by the provider.   -Colorectal cancer screening should be done for adults age 2-75 with no increased risk factors for colorectal cancer.  There are a number of acceptable methods of screening for this type of cancer. Each test has its own benefits and drawbacks. Discuss with your provider what is most appropriate for you during your annual wellness visit. The different tests include: colonoscopy (considered the best screening method), a fecal occult blood test, a fecal DNA test, and sigmoidoscopy.  -All adults born between [place] should be screened once for Hepatitis C.  -An Abdominal Aortic Aneurysm (AAA) Screening is recommended for men age 68-75 who has ever smoked in their lifetime.     Here is a list of your current Health Maintenance items (your personalized list of preventive services) with a due date:  Health Maintenance Due   Topic Date Due   ??? Shingles Vaccine (1 of 2) 09/15/1992   ??? Hemoglobin A1C    02/26/2019   ??? Yearly Flu Vaccine (1) 05/06/2019   ??? Diabetic Foot Care  06/21/2019   ??? Glaucoma Screening   07/05/2019   ??? Eye Exam  07/05/2019

## 2019-07-17 NOTE — Progress Notes (Signed)
 Goals     . Skills and education necessary to properly manage diabetes      07/18/19 - Patient feels confident with diabetes management. Reports that A1C is consistently < 7. Taking medications as prescribed. Knowledgable of diabetic diet. Recently saw PCP and seeing endocrinology 2x/year. Patient will continue plan of care.

## 2019-07-17 NOTE — Progress Notes (Signed)
Goals     ??? Skills and education necessary to properly manage diabetes      07/18/19 - Patient feels confident with diabetes management. Reports that A1C is consistently < 7. Taking medications as prescribed. Knowledgable of diabetic diet. Recently saw PCP and seeing endocrinology 2x/year. Patient will continue plan of care.

## 2019-08-11 NOTE — Progress Notes (Signed)
Attempted patient contact for CCM follow-up. Messages left requesting call back.

## 2019-09-18 NOTE — Progress Notes (Signed)
 Patient has graduated from the Complex Case Management  program on 09/18/19.  Patient/family has the ability to self-manage at this time Care management goals have been completed. No further Ambulatory Care Manager follow up scheduled.    Patient stated he was interested in obtaining the covid-19 vaccine. [facility] for [facility] given.     Goals Addressed                 This Visit's Progress    . COMPLETED: Advance Care Plan        09/18/19 - Discussed advance care planning. No ACP on file. Patient not interested at this time. States that he has discussed medical wishes with family and does not feel he needs a written document. Reviewed healthcare decision makers and confirmed information is up to date.       . COMPLETED: Skills and education necessary to properly manage diabetes        07/18/19 - Patient feels confident with diabetes management. Reports that A1C is consistently < 7. Taking medications as prescribed. Knowledgable of diabetic diet. Recently saw PCP and seeing endocrinology 2x/year. Patient will continue plan of care.     09/18/19 - Briefly reviewd diabetes management and patient is able to self manage. Completing goal.               Patient has Ambulatory Care Manager's contact information for any further questions, concerns, or needs.  Patients upcoming visits:  No future appointments.

## 2019-09-18 NOTE — Progress Notes (Signed)
Patient has graduated from the Complex Case Management  program on 09/18/19.  Patient/family has the ability to self-manage at this time Care management goals have been completed. No further Ambulatory Care Manager follow up scheduled.    Patient stated he was interested in obtaining the covid-19 vaccine. Information for [facility] given.     Goals Addressed                 This Visit's Progress    ??? COMPLETED: Advance Care Plan        09/18/19 - Discussed advance care planning. No ACP on file. Patient not interested at this time. States that he has discussed medical wishes with family and does not feel he needs a written document. Reviewed healthcare decision makers and confirmed information is up to date.       ??? COMPLETED: Skills and education necessary to properly manage diabetes        07/18/19 - Patient feels confident with diabetes management. Reports that A1C is consistently < 7. Taking medications as prescribed. Knowledgable of diabetic diet. Recently saw PCP and seeing endocrinology 2x/year. Patient will continue plan of care.     09/18/19 - Briefly reviewd diabetes management and patient is able to self manage. Completing goal.               Patient has Ambulatory Care Manager's contact information for any further questions, concerns, or needs.  Patients upcoming visits:  No future appointments.

## 2020-01-16 LAB — AMB EXT URINE MICROALBUMIN
MICROALBUMIN, URINE, EXTERNAL: 106.7 NA
Urine Microalbumin, External: 106.7

## 2020-09-18 LAB — AMB EXT HGBA1C
Hemoglobin A1C, External: 7.6 %
Hemoglobin A1c, External: 7.6 %

## 2020-09-24 LAB — LIPID PANEL
HDL, EXTERNAL: 31 NA
HDL, External: 31
LDL Cholesterol: 95 MG/DL
LDL-CHOLESTEROL: 95 MG/DL
TOTAL CHOLESTEROL, EXTERNAL: 157 NA
TRIGLYCERIDES, EXTERNAL: 175 NA
Total Cholesterol, External: 157
Triglycerides, External: 175
VLDL: 31
VLDL: 31 NA

## 2020-09-24 LAB — MICROALB/CREAT RATIO, TIMED UR: MICROALB/CRT. RATIO: 101

## 2020-09-24 LAB — AMB EXT T4, FREE
T4, FREE, EXTERNAL: 1.36 NA
T4, Free, EXTERNAL: 1.36

## 2020-09-24 LAB — TSH, EXTERNAL
TSH, EXTERNAL: 3.35 NA
TSH, External: 3.35

## 2021-07-07 ENCOUNTER — Encounter: Payer: MEDICARE | Attending: Internal Medicine | Primary: Internal Medicine

## 2021-08-04 ENCOUNTER — Encounter: Payer: MEDICARE | Attending: Internal Medicine | Primary: Internal Medicine

## 2022-02-22 NOTE — Telephone Encounter (Signed)
Location of patient: [place]    Received call from [place] at [facility] with [employer].    Subjective: Caller states "dif walking "     Current Symptoms: dif walking hard time getting up and down , and some balance issues sometimes limps , no fall , no confusion , c/o right hip pain more than left , hx diabetes does see kelly white but wants to change to Dr Shanda Howells , wife thinks may have some neuropathy also  has an endocrinologist for diabetes     Onset: 2-3 months    Associated Symptoms: NA    Pain Severity: 5 /10    Temperature: none       What has been tried: motrin    LMP: NA Pregnant: NA    Recommended disposition: See PCP within 3 Days    Care advice provided, patient verbalizes understanding; denies any other questions or concerns; instructed to call back for any new or worsening symptoms.    Patient/Caller agrees with recommended disposition; Probation officer provided warm transfer to [employer]  at [employer] for appointment scheduling    Attention Provider:  Thank you for allowing me to participate in the care of your patient.  The patient was connected to triage in response to information provided to the ECC/PSC.  Please do not respond through this encounter as the response is not directed to a shared pool.      Reason for Disposition   MODERATE pain (e.g., interferes with normal activities, limping) and present > 3 days    Protocols used: Hip Pain-ADULT-[place]

## 2022-03-05 LAB — HEMOGLOBIN A1C, EXTERNAL: Hemoglobin A1C, External: 8 %

## 2022-03-08 ENCOUNTER — Ambulatory Visit: Payer: MEDICARE | Attending: Family | Primary: Internal Medicine

## 2022-03-14 NOTE — Telephone Encounter (Signed)
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 He has trouble walking, rolled out of bed, couldn't get up. He is in [facility] now. Is there anything specific that needs to be addressed while he's there .. blood tests, etc.?    Pls call wife  [phone]

## 2022-03-15 NOTE — Telephone Encounter (Signed)
Have not seen?    You?

## 2022-03-27 NOTE — Telephone Encounter (Signed)
LVM.  Pt has not been seen in office yet, NP appt with Dr. Dolores Lory on 04/21/22.  Unable to really give advice until he has been established under neuro care.

## 2022-04-21 ENCOUNTER — Ambulatory Visit: Admit: 2022-04-21 | Payer: MEDICARE | Attending: Neurology | Primary: Internal Medicine

## 2022-04-21 ENCOUNTER — Ambulatory Visit: Admit: 2022-04-21 | Payer: MEDICARE | Primary: Internal Medicine

## 2022-04-21 DIAGNOSIS — F03A0 Unspecified dementia, mild, without behavioral disturbance, psychotic disturbance, mood disturbance, and anxiety: Secondary | ICD-10-CM

## 2022-04-21 DIAGNOSIS — I6523 Occlusion and stenosis of bilateral carotid arteries: Secondary | ICD-10-CM

## 2022-04-21 MED ORDER — DONEPEZIL HCL 10 MG PO TABS
10 MG | ORAL_TABLET | Freq: Every day | ORAL | 3 refills | Status: DC
Start: 2022-04-21 — End: 2023-03-05

## 2022-04-21 MED ORDER — DONEPEZIL HCL 5 MG PO TABS
5 MG | ORAL_TABLET | Freq: Every evening | ORAL | 0 refills | Status: DC
Start: 2022-04-21 — End: 2022-08-23

## 2022-04-21 NOTE — Progress Notes (Signed)
[facility] and [facility] at [facility] at [place] [address]  [place] [address]  [phone] Office  [phone] Facsimile           Referring: Kathreen Cosier, MD      Chief Complaint   Patient presents with    New Patient     Patient is here for having some gait issues and also having some cognitive problems. On 03/04/22 Patient rolled out of bed and was unable to get on his feet. Went to [place] dr[facility]s then to rehab.      Mr. Stairs is a 81 year old gentleman who presents today accompanied by his wife for neurologic consultation regarding progressive cognitive difficulty as well as question parkinsonism.  In any regard his wife has noticed progressive cognitive decline over the last year or longer.  There were issues with him driving so he no longer is driving.  He frequently repeats questions.  He does not recall things they discussed.  He was having difficulty remembering his medications and now his wife will administer those.  No family history of dementia.  In terms of timing of this progression she relates that prior to the COVID-19 pandemic he was still driving and playing golf regularly.  She made an appointment to see me with her husband in this regard and then in the interim the patient had also been having progressive difficulty with walking where he would shuffle his feet.  No tremor.  In any regard he fell out of bed and was unable to get up.  EMS took him to [facility] where he was hospitalized.  He had MRIs of the brain which was said to demonstrate a good amount of small vessel change.  He was seen by neurology there where question of an atypical Parkinson's was raised including vascular Parkinson's.  He has never been on any memory medicine.  He has not had any dopaminergic medication.    From [facility]  MRI of the lumbar spine  dated March 06, 2022 with multilevel degenerative change    Neurology note from Dr. Sharen Counter dated March 07, 2022 where history was the patient rolled out of bed in the sleep and could not get back up.  He noted progressive issues with his gait x4-6 months and had an appointment scheduled to see me in August.  Wife says he does not act out his dreams or sleep.  No tremor.  No focal weakness.  He moves slowly and has a hard time with turns.  Examination demonstrated some proximal right leg weakness and some bradykinesia question parkinsonian syndrome  MRI of the brain with atrophy and microangiopathic change  Laboratory analysis March 10, 2022  CBC unremarkable  Metabolic panel unremarkable  Liver function unremarkable  Lipid panel with an LDL of 72    Rehab history and physical dated March 07, 2022 where patient came in with gait instability and weakness possibly related to parkinsonian syndrome is reviewed.    Neurologic consultation by Dr. Uvaldo Rising dated March 06, 2022 where he was seen for gait difficulty and inability to get up after a fall.  Recounted 6 months of decline in gait with shuffling and bradykinesia.  Difficulty getting up from a seated position.  Examination at that time found no visible tremor but he did have axial rigidity and minimal extremity rigidity.  Clear slowing  and decrement with rapid alternating movements with a slow shuffling unsteady gait.  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    Diabetes ([place])     History of prostate cancer 10/19/2011    Diagnosis 9/11    Hypothyroid 10/23/2013    Microalbuminuria     Osteoarthritis 02/03/2009    Prostate CA ([place]) 10/19/2011    Unspecified essential hypertension 06/23/2008       Past Surgical History:   Procedure Laterality Date     COLONOSCOPY      HERNIA REPAIR      bilateral    MOHS SURGERY  05/23/2016    SCC in situ L lateral cheek by Dr. Nelva Bush     ORTHOPEDIC SURGERY  06/2007    lumbar laminectomy    ORTHOPEDIC SURGERY      right shoulder surgery       Current Outpatient Medications   Medication Sig Dispense Refill    Cholecalciferol (VITAMIN D3) 50 MCG (2000 UT) CAPS       B Complex-C-Folic Acid (SUPER B COMPLEX/FA/VIT C PO)       ONETOUCH ULTRA strip USE 1 STRIP TO CHECK GLUCOSE ONCE DAILY      ascorbic acid (VITAMIN C) 1000 MG tablet Take by mouth      aspirin 81 MG EC tablet Take 1 tablet by mouth daily      glimepiride (AMARYL) 4 MG tablet Take by mouth 2 times daily      levothyroxine (SYNTHROID) 75 MCG tablet Take by mouth every morning (before breakfast)      lisinopril (PRINIVIL;ZESTRIL) 5 MG tablet Take by mouth daily      metFORMIN (GLUCOPHAGE-XR) 500 MG extended release tablet Take 1 tablet by mouth 2 times daily       No current facility-administered medications for this visit.        Allergies   Allergen Reactions    Adhesive Tape Dermatitis     Clear surgical tape over port.    Chlorhexidine Dermatitis    Levofloxacin Swelling       Social History     Tobacco Use    Smoking status: Former     Types: Cigarettes     Quit date: 09/04/2002     Years since quitting: 19.6    Smokeless tobacco: Never   Substance Use Topics    Alcohol use: Yes     Alcohol/week: 0.8 standard drinks    Drug use: No       Family History   Problem Relation Age of Onset    Cancer Mother         breast    Diabetes Brother     Rheum Arthritis Father     Rheum Arthritis Sister        Examination  BP 113/68 (Site: Right Upper Arm, Position: Sitting, Cuff Size: Medium Adult)   Pulse 71   Resp 18   Ht '6\' 3"'$  (1.905 m)   Wt 230 lb (104.3 kg)   SpO2 94%   BMI 28.75 kg/m   He is a pleasant gentleman.  He has a paucity of spontaneous speech.  He is appropriately dressed and appropriately groomed.  He is quite happy to have his  wife provide history.  He says  that he knows his memory is not great.  He is oriented to [facility] second floor.  He is able to calculate coins.  He spells the word house forward but he transposes the O and the U going backward.  He says the president is "[place]".  He has registration 3/3 recall 0/3 and with clues 1/3.  He follows commands.  Cranial nerves are intact with no gaze limitation.  There is no tremor.  No cogwheeling.  Mild bradykinesia.  Resist in all muscle groups upper and lower extremities.  Symmetric reflexes.  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 Despite editing, there may be syntax errors.

## 2022-04-21 NOTE — Patient Instructions (Signed)
[facility] [place] [place] [facility] Test Result Communication    Test results are available in MyChart.  MyChart is the patient portal into our electronic health record.  This feature allows patients to see diagnostic test results, immunizations, allergies, past medical and surgical history, current medications, and send messages directly to providers.  Our team members at the front desk can provide additional information and assist with registration.  The MyChart support team can be reached at [phone].    In some cases, a provider might need time to explain the results in detail during a follow-up appointment.  This might include additional information or context that will help patients understand the reason for next steps in the plan of care recommended by their provider.    If a patient chooses to receive diagnostic testing at an imaging center outside of the [facility] network, it is the patient's responsibility to bring the imaging report and disc to their [place] follow-up appointment.    If the test results reveal anything that is particularly noteworthy, we will contact you to discuss the matter and, if necessary, schedule a follow-up appointment at an earlier date.    If you have not received your test results by MyChart or other communication within 7 days, please contact our office.  An inquiry can be sent to your provider using MyChart.  Alternatively, appointments can be scheduled via telephone to review results.  If a follow-up appointment to review results has not been scheduled, Our [facility] office can be reached at [phone].  For appointments at our [facility] or [place] office, please call [phone].       PRESCRIPTION REFILL POLICY    [facility]   Statement to Patients  December 03, 2012      In an effort to ensure the large volume of patient prescription refills is processed in the most efficient and expeditious  manner, we are asking our patients to assist us by calling your Pharmacy for all prescription refills, this will include also your  Mail Order Pharmacy. The pharmacy will contact our office electronically to continue the refill process.    Please do not wait until the last minute to call your pharmacy. We need at least 48 hours (2days) to fill prescriptions. We also encourage you to call your pharmacy before going to pick up your prescription to make sure it is ready.     With regard to controlled substance prescription refill requests (narcotic refills) that need to be picked up at our office, we ask your cooperation by providing us with at least 72 hours (3days) notice that you will need a refill.    We will not refill narcotic prescription refill requests after 4:00pm on any weekday, Monday through Thursday, or after 2:00pm on Fridays, or on the weekends.      We encourage everyone to explore another way of getting your prescription refill request processed using MyChart, our patient web portal through our electronic medical record system. MyChart is an efficient and effective way to communicate your medication request directly to the office and  downloadable as an app on your smart phone . MyChart also features a review functionality that allows you to view your medication list as well as leave messages for your physician. Are you ready to get connected? If so please review the attatched instructions or speak to any of our staff to get you set up right away!    Thank you   so much for your cooperation. Should you have any questions please contact our Practice Administrator.

## 2022-04-25 NOTE — Telephone Encounter (Signed)
Patient's spouse would like a call to cancel and reschedule patient's EEG.    Please contact.

## 2022-05-02 ENCOUNTER — Encounter: Payer: MEDICARE | Primary: Internal Medicine

## 2022-05-10 NOTE — Telephone Encounter (Signed)
Patient wife requesting a call to discuss her husband being referred to Dr. Novella Rob and his next NP appt not until May 2024

## 2022-05-15 ENCOUNTER — Ambulatory Visit: Admit: 2022-05-15 | Discharge: 2022-05-26 | Payer: MEDICARE | Primary: Internal Medicine

## 2022-05-15 DIAGNOSIS — F03A0 Unspecified dementia, mild, without behavioral disturbance, psychotic disturbance, mood disturbance, and anxiety: Secondary | ICD-10-CM

## 2022-05-24 NOTE — Procedures (Signed)
[facility]   Electroencephalogram Report    Procedure ID: [id number] Procedure Date: 05/15/2022   Patient Name: Stanley Holt Date of Birth: 08-23-1941   Procedure Type: Routine Medical Record No: [id number]       An EEG is requested in this 82 year old man to evaluate for epileptiform abnormalities.  Medication said to include Aricept    This tracing is obtained primarily during drowsiness and sleep.  The majority of the tracing demonstrates diffuse theta range activity.    Hyperventilation is not performed.    Intermittent photic stimulation little alters the tracing.    Stage II sleep is attained    Interpretation  This EEG recorded primarily during drowsiness and sleep demonstrates no abnormality.        Debbrah Alar, MD

## 2022-05-29 NOTE — Progress Notes (Signed)
[facility]   Carotid Doppler Report      Patient: Stanley Holt  1941/08/04  Date of Service: 04/21/2022    B-mode imaging reveals mild mixed plaquing at the bifurcations extending into the internal carotid artery segments bilaterally.  Doppler spectral analysis reveals no elevated velocities.  Vertebral artery flow is antegrade bilaterally.    Interpretation  16-49% bilateral ICA        Debbrah Alar, MD

## 2022-06-06 NOTE — Telephone Encounter (Signed)
Has appt with [facility] on 07/07/22 for neurocog eval.  Appt for f/u with Dr. Dolores Lory scheduled for 08/23/22 at 1120

## 2022-08-23 ENCOUNTER — Ambulatory Visit: Admit: 2022-08-23 | Payer: MEDICARE | Attending: Neurology | Primary: Internal Medicine

## 2022-08-23 DIAGNOSIS — F03A0 Unspecified dementia, mild, without behavioral disturbance, psychotic disturbance, mood disturbance, and anxiety: Secondary | ICD-10-CM

## 2022-08-23 NOTE — Progress Notes (Signed)
No changes in symptoms  Results

## 2022-08-23 NOTE — Progress Notes (Signed)
Psychologist, sport and exercise [facility]rology [facility]ics and [place] at [facility] [place] [address]  [place] [address]   [phone]              Chief Complaint   Patient presents with    Follow-up    Results     Current Outpatient Medications   Medication Sig Dispense Refill    Cholecalciferol (VITAMIN D3) 50 MCG (2000 UT) CAPS       B Complex-C-Folic Acid (SUPER B COMPLEX/FA/VIT C PO)       ONETOUCH ULTRA strip USE 1 STRIP TO CHECK GLUCOSE ONCE DAILY      donepezil (ARICEPT) 10 MG tablet Take 1 tablet by mouth daily 90 tablet 3    ascorbic acid (VITAMIN C) 1000 MG tablet Take by mouth      aspirin 81 MG EC tablet Take 1 tablet by mouth daily      glimepiride (AMARYL) 4 MG tablet Take by mouth 2 times daily      levothyroxine (SYNTHROID) 75 MCG tablet Take by mouth every morning (before breakfast)      lisinopril (PRINIVIL;ZESTRIL) 5 MG tablet Take 0.5 tablets by mouth daily      metFORMIN (GLUCOPHAGE-XR) 500 MG extended release tablet Take 1 tablet by mouth 2 times daily       No current facility-administered medications for this visit.      Allergies   Allergen Reactions    Adhesive Tape Dermatitis     Clear surgical tape over port.    Chlorhexidine Dermatitis    Levofloxacin Swelling     Social History     Tobacco Use    Smoking status: Former     Current packs/day: 0.00     Types: Cigarettes     Quit date: 09/04/2002     Years since quitting: 19.9    Smokeless tobacco: Never   Substance Use Topics    Alcohol use: Yes     Alcohol/week: 0.8 standard drinks of alcohol    Drug use: No     81 year old gentleman returns for follow-up regarding memory concerns.  Last visit we started him on Aricept. 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  Today he comes with his wife.  Both provide history.  His wife has noticed a little bit of a tremor the other day.  They are staying active.  He still has some  memory difficulty.  Tolerating Aricept.  They are staying physically active and he recounts going to the [facility] and walking until he really was tired out.    EEG May 15, 2022 unremarkable  Carotid Doppler August 18  Which showed from [facility] analysis July 2023  Hemoglobin A1c 8  B12 normal at 683  T. pallidum antibodies nonreactive  Formal neurocognitive evaluation with Dr. 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  He is awake alert and engaging.  I do not see a tremor today.  He arises from a chair unassisted.  He is able to ambulate down the hall without his walking stick.  He is a bit slow but he has a good stride.  He does have arm  swing.    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 Despite editing, there may be syntax errors.

## 2022-09-21 NOTE — Telephone Encounter (Signed)
Stanley Holt from [facility] requesting call back for medical clearance discussed with Lily Peer on Tue.       Pls call Stanley Holt  [phone]                                [email or web address]

## 2022-09-26 NOTE — Telephone Encounter (Signed)
Belenda Cruise would like a call back from [facility] regarding medical clearance for patient.

## 2022-10-03 NOTE — Telephone Encounter (Signed)
She's  waiting on a call from Lily Peer pertaining to a medical Clearance for the patient.    He cannot start working with a Physiological scientist until the clearance has been received.    She stated the patient has to reschedule his appt with the trainer.    She is requesting the medical clearance be emailed to :    [email or web address]

## 2022-10-23 NOTE — Telephone Encounter (Signed)
Clearance letter had been scanned in to chart

## 2022-11-24 LAB — HEMOGLOBIN A1C, EXTERNAL: Hemoglobin A1C, External: 6.9 %

## 2022-12-19 NOTE — Telephone Encounter (Signed)
There is no therapy we can give help to prevent the development of alpha gal after a tick bite.   Review with patient potential symptoms to monitor for:  What are the symptoms of alpha-gal syndrome?  The symptoms are similar to those of other food allergies. But unlike with other food allergies, symptoms usually take longer to happen. A person might not have symptoms until 2 to 8 hours after they have eaten red meat or a related food. Sometimes, people eat meat for dinner and then wake up in the night because they are having a reaction.  Mild symptoms can include:  ?Hives, which are raised patches of skin that are very itchy (picture 1)  ?Puffiness of the face, eyelids, ears, mouth, hands, or feet  Severe symptoms are also called "anaphylaxis." They can include:  ?Swelling of the throat  ?Nausea, vomiting, or diarrhea  ?Wheezing, coughing a lot, or trouble breathing  ?Feeling dizzy or passing out  ?Death  Symptoms can differ from person to person.

## 2022-12-19 NOTE — Telephone Encounter (Signed)
Patient's wife, Windell Moulding, called.  Patient was bit by a deer tick on Sunday.  They are concerned about him developing Alpha-Gal Syndrome and would like to know if there is any prophylaxis treatment he could get.    Karlis Cregg - [phone]

## 2022-12-20 NOTE — Telephone Encounter (Signed)
Spoke with spouse regarding patient tick bite. [place] bite (deer tick) left lumbar. Pulled tick out, washed with soap/water and applied neosporin. Swelling, red, feel hard. No bulls eye. Patient has small bout of diarrhea. Spouse will continue to monitor and send a picture of site. Informed per MD note. Understanding verbalized.

## 2022-12-21 ENCOUNTER — Ambulatory Visit: Admit: 2022-12-21 | Discharge: 2022-12-22 | Payer: MEDICARE | Attending: Internal Medicine | Primary: Internal Medicine

## 2022-12-21 DIAGNOSIS — S30860A Insect bite (nonvenomous) of lower back and pelvis, initial encounter: Secondary | ICD-10-CM

## 2022-12-21 MED ORDER — DOXYCYCLINE HYCLATE 100 MG PO CAPS
100 | ORAL_CAPSULE | Freq: Two times a day (BID) | ORAL | 0 refills | Status: AC
Start: 2022-12-21 — End: 2022-12-31

## 2022-12-21 NOTE — Progress Notes (Addendum)
Stanley Holt is a 82 y.o. male who was seen in clinic today (12/21/2022) for an acute visit.      Assessment & Plan:   Below is the assessment and plan developed based on review of pertinent history, physical exam, labs, studies, and medications.    1. Tick bite of lower back, initial encounter  Comments:  Counseled and will  and cover for Lyme with doxycycline 100 mg twice daily x 10 days  2. Mild dementia without behavioral disturbance, psychotic disturbance, mood disturbance, or anxiety, unspecified dementia type [facility])  Comments:  Reviewed recent note from neurology Dr. Jettie Pagan and he has been started on Aricept.     Return in about 3 months (around 03/22/2023), or if symptoms worsen or fail to improve, for  MWV.     Subjective/Objective:   Stanley Holt was seen today for Tick Removal    Patient reports noted a tick on his left back area 4 days ago.  The area of the tick bite has become increasingly red and he and his wife were concerned.  No other rash noted.  They had sent a picture on MyChart which showed some redness and were asked to come in for evaluation.    Prior to Admission medications    Medication Sig Start Date End Date Taking? Authorizing Provider   levothyroxine (SYNTHROID) 88 MCG tablet TAKE 1 TABLET BY MOUTH ONCE DAILY IN THE MORNING ON AN EMPTY STOMACH FOR 90 DAYS 11/23/22  Yes [provider]   Multiple Vitamins-Minerals (PRESERVISION AREDS 2 PO)    Yes [provider]   naproxen (NAPROSYN) 500 MG tablet Take 1 tablet by mouth 2 times daily 02/14/14  Yes [provider]   Cholecalciferol (VITAMIN D3) 50 MCG (2000 UT) CAPS    Yes [provider]   B Complex-C-Folic Acid (SUPER B COMPLEX/FA/VIT C PO)    Yes [provider]   ONETOUCH ULTRA strip USE 1 STRIP TO CHECK GLUCOSE ONCE DAILY 03/29/22  Yes [provider]   donepezil (ARICEPT) 10 MG tablet Take 1 tablet by mouth daily 04/21/22  Yes Epps, Sharol Roussel, MD   ascorbic acid (VITAMIN C) 1000 MG tablet Take by  mouth   Yes Automatic Reconciliation, Ar   aspirin 81 MG EC tablet Take 1 tablet by mouth daily   Yes Automatic Reconciliation, Ar   glimepiride (AMARYL) 4 MG tablet Take by mouth 2 times daily   Yes Automatic Reconciliation, Ar   levothyroxine (SYNTHROID) 75 MCG tablet Take by mouth every morning (before breakfast)   Yes Automatic Reconciliation, Ar   lisinopril (PRINIVIL;ZESTRIL) 5 MG tablet Take 0.5 tablets by mouth daily   Yes Automatic Reconciliation, Ar   metFORMIN (GLUCOPHAGE-XR) 500 MG extended release tablet Take 1 tablet by mouth 2 times daily   Yes Automatic Reconciliation, Ar        Review of Systems   Constitutional:  Negative for fever.   Musculoskeletal:         No new joint aching or pain.   Neurological:  Negative for headaches.        Physical Exam  Vitals and nursing note reviewed.   Constitutional:       General: He is not in acute distress.  Neurological:      Mental Status: He is alert.        Area of erythema with central erosion.  Vitals:    12/21/22 1552   BP: 98/64   Pulse: 74   Resp:  16   Temp: 98.4 F (36.9 C)   TempSrc: Temporal   SpO2: 95%   Weight: 104.4 kg (230 lb 3.2 oz)   Height: 1.905 m (6\' 3" )        Advised him to call back or return to office if symptoms worsen/change/persist.      I have discussed the diagnosis with the patient and the intended plan as seen in the above orders.  The patient has received an after-visit summary and questions were answered concerning future plans.  I have discussed medication side effects and warnings with the patient as well.  He has expressed understanding of the diagnosis and plan    Aspects of this note may have been generated using voice recognition software. Despite editing, there may be some syntax errors     Mickel Crow, MD

## 2022-12-21 NOTE — Patient Instructions (Signed)
A Healthy Lifestyle: Care Instructions  A healthy lifestyle can help you feel good, have more energy, and stay at a weight that's healthy for you. You can share a healthy lifestyle with your friends and family. And you can do it on your own.    Eat meals with your friends or family. You could try cooking together.   Plan activities with other people. Go for a walk with a friend, try a free online fitness class, or join a sports league.     Eat a variety of healthy foods. These include fruits, vegetables, whole grains, low-fat dairy, and lean protein.   Choose healthy portions of food. You can use the Nutrition Facts label on food packages as a guide.     Eat more fruits and vegetables. You could add vegetables to sandwiches or add fruit to cereal.   Drink water when you are thirsty. Limit soda, juice, and sports drinks.     Try to exercise most days. Aim for at least 2 hours of exercise each week.   Keep moving. Work in the garden or take your dog on a walk. Use the stairs instead of the elevator.     If you use tobacco or nicotine, try to quit. Ask your doctor about programs and medicines to help you quit.   Limit alcohol. Men should have no more than 2 drinks a day. Women should have no more than 1. For some people, no alcohol is the best choice.   Follow-up care is a key part of your treatment and safety. Be sure to make and go to all appointments, and call your doctor if you are having problems. It's also a good idea to know your test results and keep a list of the medicines you take.  Where can you learn more?  Go to https://www.healthwise.net/patientEd and enter U807 to learn more about "A Healthy Lifestyle: Care Instructions."  Current as of: July 18, 2021               Content Version: 13.7   2006-2023 [employer].   Care instructions adapted under license by [facility]. If you have questions about a medical condition or this instruction, always ask your healthcare professional.  Healthwise, Incorporated disclaims any warranty or liability for your use of this information.

## 2023-02-28 ENCOUNTER — Encounter: Payer: MEDICARE | Attending: Neurology | Primary: Internal Medicine

## 2023-02-28 ENCOUNTER — Encounter: Payer: MEDICARE | Primary: Internal Medicine

## 2023-03-03 ENCOUNTER — Encounter

## 2023-03-05 MED ORDER — DONEPEZIL HCL 10 MG PO TABS
10 MG | ORAL_TABLET | Freq: Every day | ORAL | 0 refills | Status: AC
Start: 2023-03-05 — End: ?

## 2023-03-16 LAB — HEMOGLOBIN A1C, EXTERNAL: Hemoglobin A1C, External: 7.2 %

## 2023-03-21 ENCOUNTER — Encounter: Payer: MEDICARE | Primary: Internal Medicine

## 2023-03-21 ENCOUNTER — Encounter: Payer: MEDICARE | Attending: Neurology | Primary: Internal Medicine

## 2023-05-31 NOTE — Progress Notes (Signed)
 Faxed son's (Charles H. Fahrner) FMLA forms to [phone]. Confirmation received.

## 2023-06-01 ENCOUNTER — Encounter

## 2023-06-04 MED ORDER — DONEPEZIL HCL 10 MG PO TABS
10 | ORAL_TABLET | Freq: Every day | ORAL | 0 refills | Status: DC
Start: 2023-06-04 — End: 2023-09-13

## 2023-07-18 LAB — GLOMERULAR FILTRATION RATE, ESTIMATED: Est, Glom Filt Rate: 50

## 2023-07-18 LAB — HEMOGLOBIN A1C, EXTERNAL: Hemoglobin A1C, External: 7.5 %

## 2023-09-13 ENCOUNTER — Ambulatory Visit: Admit: 2023-09-13 | Payer: MEDICARE | Attending: Neurology | Primary: Internal Medicine

## 2023-09-13 ENCOUNTER — Ambulatory Visit: Admit: 2023-09-13 | Payer: MEDICARE | Primary: Internal Medicine

## 2023-09-13 VITALS — BP 132/84 | HR 72 | Wt 225.0 lb

## 2023-09-13 DIAGNOSIS — G3183 Dementia with Lewy bodies: Secondary | ICD-10-CM

## 2023-09-13 MED ORDER — DONEPEZIL HCL 10 MG PO TABS
10 MG | ORAL_TABLET | Freq: Every day | ORAL | 3 refills | Status: DC
Start: 2023-09-13 — End: 2023-09-26

## 2023-09-13 NOTE — Progress Notes (Signed)
Decline in memory  [place] he is having a lot of issues with walking  Jerking while sleeping said to be whole body  Sleeping a lot more   Complaints of choking on his saliva, no issues with foods/water  Frequent bathroom usage, urine incontinence

## 2023-09-13 NOTE — Progress Notes (Signed)
Materials engineer [facility] at [facility] Neurology Clinics at [facility] [address] [place], [place] [address]  [address] [place], [place] [address]   [phone]              Chief Complaint   Patient presents with    Follow-up     Annual memory follow up     Unable to complete [place]ck     Current Outpatient Medications   Medication Sig Dispense Refill    donepezil (ARICEPT) 10 MG tablet Take 1 tablet by mouth once daily 90 tablet 0    levothyroxine (SYNTHROID) 88 MCG tablet 1 tablet Daily      Multiple Vitamins-Minerals (PRESERVISION AREDS 2 PO) in the morning and at bedtime      Cholecalciferol (VITAMIN D3) 50 MCG (2000 UT) CAPS       B Complex-C-Folic Acid (SUPER B COMPLEX/FA/VIT C PO)       ONETOUCH ULTRA strip USE 1 STRIP TO CHECK GLUCOSE ONCE DAILY      ascorbic acid (VITAMIN C) 1000 MG tablet Take by mouth      aspirin 81 MG EC tablet Take 1 tablet by mouth daily      glimepiride (AMARYL) 4 MG tablet Take by mouth 2 times daily      lisinopril (PRINIVIL;ZESTRIL) 5 MG tablet Take 0.5 tablets by mouth daily      metFORMIN (GLUCOPHAGE-XR) 500 MG extended release tablet Take 1 tablet by mouth 2 times daily       No current facility-administered medications for this visit.      Allergies   Allergen Reactions    Latex      Other Reaction(s): Adverse reaction to substance; Severity: Mild to Moderate    Adhesive Tape Dermatitis     Clear surgical tape over port.    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  Last visit with me was in December 2023 and we continued Aricept.  History is reported by his wife in the presence of the patient.    He has been experiencing a significant decline in his memory function, as reported by the wife. His mobility has also been compromised, with instances of him freezing and being unable to turn. He exhibits hand tremors and jerking movements during sleep. Additionally, he struggles with saliva management, either swallowing or expectorating it. His speech is often incoherent. He had a fall while attempting to step over a drain pipe, resulting in a knee injury.  His wife believes that professional physical therapy could be beneficial. She also reports feeling overwhelmed by his care needs at times and is considering additional support. His son is currently assisting with his care. He has not engaged in any recent physical  therapy.    Examination  BP 132/84 (Site: Left Upper Arm, Position: Sitting, Cuff Size: Medium Adult)   Pulse 72   Wt 102.1 kg (225 lb)   SpO2 93%   BMI 28.12 kg/m   He is pleasant.  He appears well cared for.  He knows it is Thursday.  He knows it is his birthday month.  He does not know the year.  He does not know the floor.  He cannot calculate coins.  No visible tremor today.  He is bradykinetic.  He has cogwheeling at the wrist bilaterally.  Slow to arise from the chair.  He has a shuffling gait with a hunched posture.    Assessment & Plan  1. Lewy body dementia.  The patient's symptoms, including difficulty walking, tremors, jerking at night, and issues with saliva, suggest a Lewy body type dementia. He is currently on Aricept (donepezil), which will be continued. Physical therapy will be initiated to help with walking difficulties.  We will also see if we can get some assistance with home health aide as well as occupational therapy evaluation  A resource packet was given  to his wife  We did discuss that his progression is not unexpected  Follow-up 6-8 months    Kirstie Mirza, MD      The patient (or guardian, if applicable) and other individuals in attendance with the patient were advised that Artificial Intelligence will be utilized during this visit to record, process the conversation to generate a clinical note, and support improvement of the AI technology. The patient (or guardian, if applicable) and other individuals in attendance at the appointment consented to the use of AI, including the recording.        This note was created using voice recognition software. Despite editing, there may be syntax errors.

## 2023-09-26 ENCOUNTER — Encounter

## 2023-09-26 MED ORDER — DONEPEZIL HCL 10 MG PO TABS
10 | ORAL_TABLET | Freq: Every day | ORAL | 0 refills | Status: AC
Start: 2023-09-26 — End: ?

## 2023-10-11 ENCOUNTER — Ambulatory Visit: Admit: 2023-10-11 | Discharge: 2023-10-19 | Payer: MEDICARE | Attending: Internal Medicine | Primary: Internal Medicine

## 2023-10-11 ENCOUNTER — Encounter

## 2023-10-11 VITALS — BP 109/72 | HR 84 | Temp 97.50000°F | Resp 16 | Ht 75.0 in | Wt 236.4 lb

## 2023-10-11 DIAGNOSIS — Z0000 Encounter for general adult medical examination without abnormal findings: Secondary | ICD-10-CM

## 2023-10-11 MED ORDER — TETANUS-DIPHTH-ACELL PERTUSSIS 5-2.5-18.5 LF-MCG/0.5 IM SUSP
5-2.5-18.5-0.5 | Freq: Once | INTRAMUSCULAR | 0 refills | Status: AC
Start: 2023-10-11 — End: 2023-10-11

## 2023-10-11 NOTE — Assessment & Plan Note (Signed)
Reviewed recent lab work ordered by his endocrinologist Dr. Mellody Dance.  Hemoglobin A1c 7.5.  Continue with diabetic diet and current medications.

## 2023-10-11 NOTE — Progress Notes (Signed)
 Medicare Annual Wellness Visit    Stanley Holt is here for Medicare AWV    Assessment & Plan   Medicare annual wellness visit, subsequent  Health maintenance reviewed and updated with patient today at visit.    Reviewed vaccine recommendations.  Form completed for adult daycare.  Controlled type 2 diabetes mellitus with stage 3 chronic kidney disease, without long-term current use of insulin (HCC)  Assessment & Plan:   Reviewed recent lab work ordered by his endocrinologist Dr. Mellody Holt.  Hemoglobin A1c 7.5.  Continue with diabetic diet and current medications.  Orders:  -     Albumin/Creatinine Ratio, Urine; Future  History of prostate cancer   Has not been seen by his urologist over the past year.  Will check PSA and pending results may need to go back to his urologist  -     PSA, Diagnostic; Future  Hypothyroidism, unspecified type  Assessment & Plan:   At goal continue with current medication and followed by endocrinology.  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  Currently on Aricept.       Return in 6 months (on 04/09/2024) for FOLLOWUP.     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   Exercise: doing PT Diet diabetic has gained 6 pounds over past year.   Seeing Dr Stanley Holt for lewy body dementia. He is doing PT at home ordered by Dr Stanley Holt.      BS: 150-190 in am.    Last A1c 7.5.   Sees podiatry  Patient's complete Health Risk Assessment and screening values have been reviewed and are found in Flowsheets. The following problems were reviewed today and where indicated follow up appointments were made and/or referrals ordered.    Positive Risk Factor Screenings with Interventions:    Fall Risk:  Do you feel unsteady or are you worried about falling? : (!) yes  2 or more falls in past year?: no  Fall with injury in past year?: no     Interventions:    Reviewed medications, home hazards, visual acuity, and co-morbidities that can increase risk for falls  See AVS for additional education material    Cognitive:   Clock Drawing Test (CDT): (!) Abnormal  Words recalled: 0 Words Recalled  Total Score: (!) 0  Total Score Interpretation: Abnormal Mini-Cog    Interventions:  Followed by neurology with diagnosis of dementia . On medication    Depression:  Depression Unable to Assess: Pt refuses  PHQ-2 Score: 2  PHQ-9 Total Score: 2               General HRA Questions:  Select all that apply: (!) New or Increased Pain    Interventions - Pain:  See AVS for additional education material      Inactivity:  On average, how  many days per week do you engage in moderate to strenuous exercise (like a brisk walk)?: 0 days (!) Abnormal  On average, how many minutes do you engage in exercise at this level?: 10 min    Interventions:  See AVS for additional education material       Hearing Screen:  Do you or your family notice any trouble with your hearing that hasn't been managed with hearing aids?: (!) Yes    Interventions:  Declines wearing hearing aids    Vision Screen:  Do you have difficulty driving, watching TV, or doing any of your daily activities because of your eyesight?: (!) Yes  Have you had an eye exam within the past year?: Yes    Interventions:   See AVS for additional education material     ADL's:   Patient reports needing help with:  Select all that apply: (!)  Dressing, Grooming, Bathing, Toileting, Walking/Balance  Select all that apply: [employer], Housekeeping, Banking/Finances, Shopping, Telephone Use, Presenter, broadcasting, Transportation, Taking Medications    Interventions:  Wife assists.   See AVS for additional education material        Review of Systems   Constitutional:  Negative for fever.   HENT:  Positive for hearing loss (has hearing aids but not wearing.). Negative for congestion and sore throat.    Respiratory:  Negative for cough and shortness of breath.    Cardiovascular:  Positive for leg swelling. Negative for chest pain.   Gastrointestinal:  Negative for abdominal pain, blood in stool, constipation, diarrhea and nausea.   Musculoskeletal:  Negative for back pain and joint swelling.   Skin:  Negative for rash.   Psychiatric/Behavioral:  Negative for dysphoric mood.         Physical Exam  Vitals and nursing note reviewed.   Constitutional:       General: He is not in acute distress.  HENT:      Right Ear: Tympanic membrane and ear canal normal.      Left Ear: Tympanic membrane and ear canal normal.      Nose: Nose normal.      Mouth/Throat:      Pharynx: Oropharynx is clear.   Neck:      Thyroid: No thyromegaly.      Vascular: No carotid bruit.   Cardiovascular:      Rate and Rhythm: Normal rate and regular rhythm.   Pulmonary:      Effort: Pulmonary effort is normal.      Breath sounds: Normal breath sounds.   Abdominal:      General: Bowel sounds are normal. There is no distension.      Palpations: Abdomen is soft. There is no mass.      Tenderness: There is no abdominal tenderness.   Musculoskeletal:      Right lower leg: No edema.      Left lower leg: No edema.   Neurological:      Mental Status: He is alert.      Motor: Weakness (Lower extremity bilateral) present.   Psychiatric:         Mood and Affect: Mood normal.                Objective   Vitals:    10/11/23 1350   BP: 109/72   Pulse: 84   Resp: 16   Temp: 97.5 F (36.4 C)   TempSrc: Temporal    SpO2: 95%   Weight: 107.2 kg (236 lb 6.4  oz)   Height: 1.905 m (6\' 3" )      Body mass index is 29.55 kg/m.                    Allergies   Allergen Reactions    Latex      Other Reaction(s): Adverse reaction to substance; Severity: Mild to Moderate    Adhesive Tape Dermatitis     Clear surgical tape over port.    Chlorhexidine Dermatitis    Levofloxacin Swelling    Wound Dressings      Other Reaction(s): red skin     Prior to Visit Medications    Medication Sig Taking? Authorizing Provider   donepezil (ARICEPT) 10 MG tablet Take 1 tablet by mouth once daily Yes Epps, Sharol Roussel, MD   levothyroxine (SYNTHROID) 88 MCG tablet 1 tablet Daily Yes [provider]   Multiple Vitamins-Minerals (PRESERVISION AREDS 2 PO) in the morning and at bedtime Yes [provider]   Cholecalciferol (VITAMIN D3) 50 MCG (2000 UT) CAPS  Yes [provider]   B Complex-C-Folic Acid (SUPER B COMPLEX/FA/VIT C PO)  Yes [provider]   ONETOUCH ULTRA strip USE 1 STRIP TO CHECK GLUCOSE ONCE DAILY Yes [provider]   ascorbic acid (VITAMIN C) 1000 MG tablet Take by mouth Yes Automatic Reconciliation, Ar   aspirin 81 MG EC tablet Take 1 tablet by mouth daily Yes Automatic Reconciliation, Ar   glimepiride (AMARYL) 4 MG tablet Take by mouth 2 times daily Yes Automatic Reconciliation, Ar   lisinopril (PRINIVIL;ZESTRIL) 5 MG tablet Take 0.5 tablets by mouth daily Yes Automatic Reconciliation, Ar   metFORMIN (GLUCOPHAGE-XR) 500 MG extended release tablet Take 1 tablet by mouth 2 times daily Yes Automatic Reconciliation, Ar       CareTeam (Including outside providers/suppliers regularly involved in providing care):   Patient Care Team:  Johnsie Kindred, MD as PCP - General  Karver Fadden, Theron Arista, MD as PCP - Empaneled Provider  Epps, Sharol Roussel, MD as Consulting Physician (Neurology)     Recommendations for Preventive Services Due: see orders and patient instructions/AVS.  Recommended screening schedule for the  next 5-10 years is provided to the patient in written form: see Patient Instructions/AVS.     Reviewed and updated this visit:  Tobacco  Allergies  Meds  Problems  Med Hx  Surg Hx  Fam Hx  Sexual   Hx

## 2023-10-11 NOTE — Assessment & Plan Note (Addendum)
At goal continue with current medication and followed by endocrinology.

## 2023-10-11 NOTE — Patient Instructions (Signed)
 Preventing Falls: Care Instructions  Injuries and health problems such as trouble walking or poor eyesight can increase your risk of falling. So can some medicines. But there are things you can do to help prevent falls. You can exercise to get stronger. You can also arrange your home to make it safer.    Talk to your doctor about the medicines you take. Ask if any of them increase the risk of falls and whether they can be changed or stopped.   Try to exercise regularly. It can help improve your strength and balance. This can help lower your risk of falling.         Practice fall safety and prevention.   Wear low-heeled shoes that fit well and give your feet good support. Talk to your doctor if you have foot problems that make this hard.  Carry a cellphone or wear a medical alert device that you can use to call for help.  Use stepladders instead of chairs to reach high objects. Don't climb if you're at risk for falls. Ask for help, if needed.  Wear the correct eyeglasses, if you need them.        Make your home safer.   Remove rugs, cords, clutter, and furniture from walkways.  Keep your house well lit. Use night-lights in hallways and bathrooms.  Install and use sturdy handrails on stairways.  Wear nonskid footwear, even inside. Don't walk barefoot or in socks without shoes.        Be safe outside.   Use handrails, curb cuts, and ramps whenever possible.  Keep your hands free by using a shoulder bag or backpack.  Try to walk in well-lit areas. Watch out for uneven ground, changes in pavement, and debris.  Be careful in the winter. Walk on the grass or gravel when sidewalks are slippery. Use de-icer on steps and walkways. Add non-slip devices to shoes.    Put grab bars and nonskid mats in your shower or tub and near the toilet. Try to use a shower chair or bath bench when bathing.   Get into a tub or shower by putting in your weaker leg first. Get out with your strong side first. Have a phone or medical alert  device in the bathroom with you.   Where can you learn more?  Go to [employer] and enter G117 to learn more about "Preventing Falls: Care Instructions."  Current as of: April 04, 2023  Content Version: 14.3   [address] [place].   Care instructions adapted under license by [facility]. If you have questions about a medical condition or this instruction, always ask your healthcare professional. [place]rene [place], [facility], disclaims any warranty or liability for your use of this information.         Learning About Mild Cognitive Impairment (MCI)  What is mild cognitive impairment (MCI)?     It's common to forget things sometimes as we get older. But some older people have memory loss that's more than normal aging. It's called mild cognitive impairment, or MCI. It is not the same as dementia.  People with the condition often know that their memory or mental function has changed. Tests may show some loss. But their minds work well overall. They can carry out daily tasks that are normal for them.  People with MCI have a higher chance of one day getting dementia. But not all people who have it will get dementia. Some people may stay the same over time.  What  are the symptoms?  People with MCI have more memory loss than what occurs with normal aging. They may have increasing trouble with recalling words and keeping up with conversations. They may also have trouble remembering important events and making decisions.  What puts you at risk?  The risk of getting MCI increases with age. Having high blood pressure or having a family history of MCI may also increase your risk.  How is it diagnosed?  Your doctor will do a physical exam.  You may be asked questions to check your memory and other mental skills. Your doctor may also talk to close friends and family members. This can help the doctor figure out how your memory and other mental skills have changed.  You may get blood tests and tests that  look at your brain.  These questions and tests can make sure you don't have other conditions that can cause symptoms like MCI. These include depression, sleep problems, and side effects from medicines.  How is it treated?  There are no medicines to treat MCI or to keep it from progressing to dementia. But treating conditions like high blood pressure and diabetes may help. A person with MCI needs routine follow-up visits with their doctor to check on changes in the person's mental skills.  How can you care for yourself at home?  Keeping your body active can help slow MCI. Exercises like walking can help. Try to stay active mentally too. Read or do things like crossword puzzles if you enjoy doing them.  If you need help coping with MCI, you may want to get support from family, friends, a support group, or a counselor who works with people who have MCI.  Though the future isn't always clear, it can be good to plan ahead with instructions for your care. These are called advanced directives. Having a plan can help make sure that you get the care you want.  Current as of: August 23, 2022  Content Version: 14.3   [address], [place].   Care instructions adapted under license by [facility]. If you have questions about a medical condition or this instruction, always ask your healthcare professional. Larene [place], [facility], disclaims any warranty or liability for your use of this information.         Learning About Being Active as an Older Adult  Why is being active important as you get older?     Being active is one of the best things you can do for your health. And it's never too late to start. Being active--or getting active, if you aren't already--has definite benefits. It can:  Give you more energy,  Keep your mind sharp.  Improve balance to reduce your risk of falls.  Help you manage chronic illness with fewer medicines.  No matter how old you are, how fit you are, or what health problems you have, there is  a form of activity that will work for you. And the more physical activity you can do, the better your overall health will be.  What kinds of activity can help you stay healthy?  Being more active will make your daily activities easier. Physical activity includes planned exercise and things you do in daily life. There are four types of activity:  Aerobic.  Doing aerobic activity makes your heart and lungs strong.  Includes walking, dancing, and gardening.  Aim for at least 2 hours spread throughout the week.  It improves your energy and can help you sleep better.  Muscle-strengthening.  This type of activity can help maintain muscle and strengthen bones.  Includes climbing stairs, using resistance bands, and lifting or carrying heavy loads.  Aim for at least twice a week.  It can help protect the knees and other joints.  Stretching.  Stretching gives you better range of motion in joints and muscles.  Includes upper arm stretches, calf stretches, and gentle yoga.  Aim for at least twice a week, preferably after your muscles are warmed up from other activities.  It can help you function better in daily life.  Balancing.  This helps you stay coordinated and have good posture.  Includes heel-to-toe walking, tai chi, and certain types of yoga.  Aim for at least 3 days a week.  It can reduce your risk of falling.  Even if you have a hard time meeting the recommendations, it's better to be more active than less active. All activity done in each category counts toward your weekly total. You'd be surprised how daily things like carrying groceries, keeping up with grandchildren, and taking the stairs can add up.  What keeps you from being active?  If you've had a hard time being more active, you're not alone. Maybe you remember being able to do more. Or maybe you've never thought of yourself as being active. It's frustrating when you can't do the things you want. Being more active can help. What's holding you back?  Getting  started.  Have a goal, but break it into easy tasks. Small steps build into big accomplishments.  Staying motivated.  If you feel like skipping your activity, remember your goal. Maybe you want to move better and stay independent. Every activity gets you one step closer.  Not feeling your best.  Start with 5 minutes of an activity you enjoy. Prove to yourself you can do it. As you get comfortable, increase your time.  You may not be where you want to be. But you're in the process of getting there. Everyone starts somewhere.  How can you find safe ways to stay active?  Talk with your doctor about any physical challenges you're facing. Make a plan with your doctor if you have a health problem or aren't sure how to get started with activity.  If you're already active, ask your doctor if there is anything you should change to stay safe as your body and health change.  If you tend to feel dizzy after you take medicine, avoid activity at that time. Try being active before you take your medicine. This will reduce your risk of falls.  If you plan to be active at home, make sure to clear your space before you get started. Remove things like TV cords, coffee tables, and throw rugs. It's safest to have plenty of space to move freely.  The key to getting more active is to take it slow and steady. Try to improve only a little bit at a time. Pick just one area to improve on at first. And if an activity hurts, stop and talk to your doctor.  Where can you learn more?  Go to [employer] and enter P600 to learn more about "Learning About Being Active as an Older Adult."  Current as of: April 04, 2023  Content Version: 14.3   [address], [place].   Care instructions adapted under license by [facility]. If you have questions about a medical condition or this instruction, always ask your healthcare professional. [place], [facility], disclaims any warranty or liability  for your use of this  information.         Hearing Loss: Care Instructions  Overview     Hearing loss is a sudden or slow decrease in how well you hear. It can range from slight to profound. Permanent hearing loss can occur with aging. It also can happen when you are exposed long-term to loud noise. Examples include listening to loud music, riding motorcycles, or being around other loud machines.  Hearing loss can affect your work and home life. It can make you feel lonely or depressed. You may feel that you have lost your independence. But hearing aids and other devices can help you hear better and feel connected to others.  Follow-up care is a key part of your treatment and safety. Be sure to make and go to all appointments, and call your doctor if you are having problems. It's also a good idea to know your test results and keep a list of the medicines you take.  How can you care for yourself at home?  Avoid loud noises whenever possible. This helps keep your hearing from getting worse.  Always wear hearing protection around loud noises.  Wear a hearing aid as directed.  A professional can help you pick a hearing aid that will work best for you.  You can also get hearing aids over the counter for mild to moderate hearing loss.  Have hearing tests as your doctor suggests. They can show whether your hearing has changed. Your hearing aid may need to be adjusted.  Use other devices as needed. These may include:  Telephone amplifiers and hearing aids that can connect to a television, stereo, radio, or microphone.  Devices that use lights or vibrations. These alert you to the doorbell, a ringing telephone, or a baby monitor.  Television closed-captioning. This shows the words at the bottom of the screen. Most new TVs can do this.  TTY (text telephone). This lets you type messages back and forth on the telephone instead of talking or listening. These devices are also called TDD. When messages are typed on the keyboard, they are sent over the  phone line to a receiving TTY. The message is shown on a monitor.  Use text messaging, social media, and email if it is hard for you to communicate by telephone.  Try to learn a listening technique called speechreading. It is not lipreading. You pay attention to people's gestures, expressions, posture, and tone of voice. These clues can help you understand what a person is saying. Face the person you are talking to, and have them face you. Make sure the lighting is good. You need to see the other person's face clearly.  Think about counseling if you need help to adjust to your hearing loss.  When should you call for help?  Watch closely for changes in your health, and be sure to contact your doctor if:    You think your hearing is getting worse.     You have new symptoms, such as dizziness or nausea.   Where can you learn more?  Go to [employer] and enter R798 to learn more about "Hearing Loss: Care Instructions."  Current as of: May 31, 2022  Content Version: 14.3   [address] [place].   Care instructions adapted under license by [facility]. If you have questions about a medical condition or this instruction, always ask your healthcare professional. Larene [place], [facility], disclaims any warranty or liability for your use of this information.  Learning About Vision Tests  What are vision tests?     The four most common vision tests are visual acuity tests, refraction, visual field tests, and color vision tests.  Visual acuity (sharpness) tests  These tests are used:  To see if you need glasses or contact lenses.  To monitor an eye problem.  To check an eye injury.  Visual acuity tests are done as part of routine exams. You may also have this test when you get your driver's license or apply for some types of jobs.  Visual field tests  These tests are used:  To check for vision loss in any area of your range of vision.  To screen for certain eye diseases.  To look for  nerve damage after a stroke, head injury, or other problem that could reduce blood flow to the brain.  Refraction and color tests  A refraction test is done to find the right prescription for glasses and contact lenses.  A color vision test is done to check for color blindness.  Color vision is often tested as part of a routine exam. You may also have this test when you apply for a job where recognizing different colors is important, such as truck driving, Optician, dispensing, or the [employer].  How are vision tests done?  Visual acuity test   You cover one eye at a time.  You read aloud from a wall chart across the room.  You read aloud from a small card that you hold in your hand.  Refraction   You look into a special device.  The device puts lenses of different strengths in front of each eye to see how strong your glasses or contact lenses need to be.  Visual field tests   Your doctor may have you look through special machines.  Or your doctor may simply have you stare straight ahead while they move a finger into and out of your field of vision.  Color vision test   You look at pieces of printed test patterns in various colors. You say what number or symbol you see.  Your doctor may have you trace the number or symbol using a pointer.  How do these tests feel?  There is very little chance of having a problem from this test. If dilating drops are used for a vision test, they may make the eyes sting and cause a medicine taste in the mouth.  Follow-up care is a key part of your treatment and safety. Be sure to make and go to all appointments, and call your doctor if you are having problems. It's also a good idea to know your test results and keep a list of the medicines you take.  Where can you learn more?  Go to [employer] and enter G551 to learn more about "Learning About Vision Tests."  Current as of: April 04, 2023  Content Version: 14.3   [address], [place].   Care instructions adapted  under license by [facility]. If you have questions about a medical condition or this instruction, always ask your healthcare professional. [place]rene [place], [facility], disclaims any warranty or liability for your use of this information.         Learning About Activities of Daily Living  What are activities of daily living?     Activities of daily living (ADLs) are the basic self-care tasks you do every day. These include eating, bathing, dressing, and moving around.  As you age, and if you have health  problems, you may find that it's harder to do some of these tasks. If so, your doctor can suggest ideas that may help.  To measure what kind of help you may need, your doctor will ask how well you are able to do ADLs. Let your doctor know if there are any tasks that you are having trouble doing. This is an important first step to getting help. And when you have the help you need, you can stay as independent as possible.  How will a doctor assess your ADLs?  Asking about ADLs is part of a routine health checkup your doctor will likely do as you age. Your health check might be done in a doctor's office, in your home, or at a hospital. The goal is to find out if you are having any problems that could make it hard to care for yourself or that make it unsafe for you to be on your own.  To measure your ADLs, your doctor will ask how hard it is for you to do routine tasks. Your doctor may also want to know if you have changed the way you do a task because of a health problem. Your doctor may watch how you:  Walk back and forth.  Keep your balance while you stand or walk.  Move from sitting to standing or from a bed to a chair.  Button or unbutton a [employer] fast streamer.  Remove and put on your shoes.  It's common to feel a little worried or anxious if you find you can't do all the things you used to be able to do. Talking with your doctor about ADLs is a way to make sure you're as safe as possible and able to care for yourself as  well as you can. You may want to bring a caregiver, friend, or family member to your checkup. They can help you talk to your doctor.  Follow-up care is a key part of your treatment and safety. Be sure to make and go to all appointments, and call your doctor if you are having problems. It's also a good idea to know your test results and keep a list of the medicines you take.  Current as of: June 27, 2022  Content Version: 14.3   [address], [place].   Care instructions adapted under license by [facility]. If you have questions about a medical condition or this instruction, always ask your healthcare professional. [place]rene [place], [facility], disclaims any warranty or liability for your use of this information.         A Healthy Heart: Care Instructions  Overview     Coronary artery disease, also called heart disease, occurs when a substance called plaque builds up in the vessels that supply oxygen-rich blood to your heart muscle. This can narrow the blood vessels and reduce blood flow. A heart attack happens when blood flow is completely blocked. A high-fat diet, smoking, and other factors increase the risk of heart disease.  Your doctor has found that you have a chance of having heart disease. A heart-healthy lifestyle can help keep your heart healthy and prevent heart disease. This lifestyle includes eating healthy, being active, staying at a weight that's healthy for you, and not smoking or using tobacco. It also includes taking medicines as directed, managing other health conditions, and trying to get a healthy amount of sleep.  Follow-up care is a key part of your treatment and safety. Be sure to make and go to all appointments,  and call your doctor if you are having problems. It's also a good idea to know your test results and keep a list of the medicines you take.  How can you care for yourself at home?  Diet    Use less salt when you cook and eat. This helps lower your blood pressure. Taste food  before salting. Add only a little salt when you think you need it. With time, your taste buds will adjust to less salt.     Eat fewer snack items, fast foods, canned soups, and other high-salt, high-fat, processed foods.     Read food labels and try to avoid saturated and trans fats. They increase your risk of heart disease by raising cholesterol levels.     Limit the amount of solid fat--butter, margarine, and shortening--you eat. Use olive, peanut, or canola oil when you cook. Bake, broil, and steam foods instead of frying them.     Eat a variety of fruit and vegetables every day. Dark green, deep orange, red, or yellow fruits and vegetables are especially good for you. Examples include spinach, carrots, peaches, and berries.     Foods high in fiber can reduce your cholesterol and provide important vitamins and minerals. High-fiber foods include whole-grain cereals and breads, oatmeal, beans, brown rice, citrus fruits, and apples.     Eat lean proteins. Heart-healthy proteins include seafood, lean meats and poultry, eggs, beans, peas, nuts, seeds, and soy products.     Limit drinks and foods with added sugar. These include candy, desserts, and soda pop.   Heart-healthy lifestyle    If your doctor recommends it, get more exercise. For many people, walking is a good choice. Or you may want to swim, bike, or do other activities. Bit by bit, increase the time you're active every day. Try for at least 30 minutes on most days of the week.     Try to quit or cut back on using tobacco and other nicotine products. This includes smoking and vaping. If you need help quitting, talk to your doctor about stop-smoking programs and medicines. These can increase your chances of quitting for good. Quitting is one of the most important things you can do to protect your heart. It is never too late to quit. Try to avoid secondhand smoke too.     Stay at a weight that's healthy for you. Talk to your doctor if you need help losing  weight.     Try to get 7 to 9 hours of sleep each night.     Limit alcohol to 2 drinks a day for men and 1 drink a day for women. Too much alcohol can cause health problems.     Manage other health problems such as diabetes, high blood pressure, and high cholesterol. If you think you may have a problem with alcohol or drug use, talk to your doctor.   Medicines    Take your medicines exactly as prescribed. Call your doctor if you think you are having a problem with your medicine.     If your doctor recommends aspirin, take the amount directed each day. Make sure you take aspirin and not another kind of pain reliever, such as acetaminophen (Tylenol).   When should you call for help?   Call 911 if you have symptoms of a heart attack. These may include:    Chest pain or pressure, or a strange feeling in the chest.     Sweating.     Shortness of  breath.     Pain, pressure, or a strange feeling in the back, neck, jaw, or upper belly or in one or both shoulders or arms.     Lightheadedness or sudden weakness.     A fast or irregular heartbeat.   After you call 911, the operator may tell you to chew 1 adult-strength or 2 to 4 low-dose aspirin. Wait for an ambulance. Do not try to drive yourself.  Watch closely for changes in your health, and be sure to contact your doctor if you have any problems.  Where can you learn more?  Go to [employer] and enter F075 to learn more about "A Healthy Heart: Care Instructions."  Current as of: April 04, 2023  Content Version: 14.3   [address], [place].   Care instructions adapted under license by [facility]. If you have questions about a medical condition or this instruction, always ask your healthcare professional. [place], [facility], disclaims any warranty or liability for your use of this information.    Personalized Preventive Plan for Stanley Holt - 10/11/2023  Medicare offers a range of preventive health benefits. Some of the tests and  screenings are paid in full while other may be subject to a deductible, co-insurance, and/or copay.  Some of these benefits include a comprehensive review of your medical history including lifestyle, illnesses that may run in your family, and various assessments and screenings as appropriate.  After reviewing your medical record and screening and assessments performed today your provider may have ordered immunizations, labs, imaging, and/or referrals for you.  A list of these orders (if applicable) as well as your Preventive Care list are included within your After Visit Summary for your review.

## 2023-10-11 NOTE — ACP (Advance Care Planning) (Signed)
Advance Care Planning   The patient has the following advanced directives on file:  Advance Directives       Power of Attorney Living Will ACP-Advance Directive ACP-Power of Attorney    Not on File Not on File Not on File Not on File            The patient has appointed the following active healthcare agents:    Primary Decision Maker: Stanley Holt, Stanley Holt - Spouse - [phone]    Secondary Decision Maker: Stanley Holt, Stanley Holt - Child - [phone]    The Patient has the following current code status:    Code Status: Not on file    Visit Documentation:  Brings to visit today and will scan to chart.     Mickel Crow, MD  10/11/2023

## 2023-10-11 NOTE — Assessment & Plan Note (Addendum)
Monitored by specialist- no acute findings meriting change in the plan seeing Dr Jettie Pagan neurology.  Currently on Aricept.

## 2023-10-11 NOTE — Assessment & Plan Note (Signed)
Has not been seen by his urologist over the past year.  Will check PSA and pending results may need to go back to his urologist.

## 2023-10-12 LAB — PSA, DIAGNOSTIC: PSA: 0.2 ng/mL (ref 0.01–4.0)

## 2023-10-12 NOTE — Progress Notes (Signed)
[facility] form and OV note faxed to [phone] per MD request. Confirmation received.

## 2023-10-15 NOTE — Addendum Note (Signed)
Addended by: Milus Glazier on: 10/15/2023 08:48 AM     Modules accepted: Orders

## 2023-10-19 ENCOUNTER — Encounter

## 2023-10-19 LAB — ALBUMIN/CREATININE RATIO, URINE
Albumin Urine: 17.9 mg/dL
Albumin/Creatinine Ratio: 172 mg/g — ABNORMAL HIGH (ref 0–30)
Creatinine, Ur: 104 mg/dL

## 2023-12-22 ENCOUNTER — Encounter

## 2023-12-22 MED ORDER — DONEPEZIL HCL 10 MG PO TABS
10 | ORAL_TABLET | Freq: Every day | ORAL | 0 refills | 30.00000 days | Status: DC
Start: 2023-12-22 — End: 2024-03-12

## 2023-12-24 ENCOUNTER — Ambulatory Visit: Admit: 2023-12-24 | Discharge: 2023-12-31 | Payer: MEDICARE | Primary: Internal Medicine

## 2023-12-24 ENCOUNTER — Ambulatory Visit
Admit: 2023-12-24 | Discharge: 2023-12-24 | Payer: MEDICARE | Attending: Orthopaedic Surgery | Primary: Internal Medicine

## 2023-12-24 VITALS — Ht 75.0 in | Wt 230.0 lb

## 2023-12-24 DIAGNOSIS — M25561 Pain in right knee: Secondary | ICD-10-CM

## 2023-12-24 MED ORDER — METHYLPREDNISOLONE ACETATE 40 MG/ML IJ SUSP
40 | Freq: Once | INTRAMUSCULAR | Status: AC
Start: 2023-12-24 — End: 2023-12-24
  Administered 2023-12-24: 21:00:00 40 mg via INTRA_ARTICULAR

## 2023-12-24 NOTE — Progress Notes (Signed)
 Stanley Holt (DOB: 12-24-1940) is a 83 y.o. male patient, here for evaluation of the following chief complaint(s):  Knee Pain (R knee pain)       ASSESSMENT/PLAN:  Below is the assessment and plan developed based on review of pertinent history, physical exam, labs, studies, and medications.    83 year old male comes in today complaining of right-sided knee pain and discomfort.  He is accompanied by his wife.  He does have some dementia and is being treated with PT for Parkinson's.  They have noted that his right knee seems to bother him and give way at times.  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  Is that he is ligamentously stable.  Discussed options with his wife.  I would continue PT.  We discussed an injection to see if this helps.  He has no interest in surgery.  I injected 40 mg Depo-Medrol  in the right knee joint sterile technique.  Risk benefits discussed.  Plan for follow-up as needed      1. Right knee pain, unspecified chronicity  -     XR KNEE RIGHT (MIN 4 VIEWS); Future  2. Arthritis of knee, right  -     DRAIN/INJECT LARGE JOINT/BURSA  -     methylPREDNISolone  acetate (DEPO-MEDROL ) injection 40 mg; 40 mg, Intra-artICUlar, ONCE, 1 dose, On Mon 12/24/23 at 1715      Encounter Diagnoses   Name Primary?    Right knee pain, unspecified chronicity Yes    Arthritis of knee, right         No follow-ups on file.      SUBJECTIVE/OBJECTIVE:  Stanley Holt (DOB: [birth year]) is a 83 y.o. male who presents today for the following:  Chief Complaint   Patient presents with    Knee Pain     R knee pain       83 year old male comes in today complaining of right-sided knee pain and discomfort.  He is accompanied by his wife.  He does have some dementia and is being treated with PT for Parkinson's.  They have noted that his right knee seems to bother him and give way at times.  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  Is that he is ligamentously stable.     IMAGING:  XR  Results (most recent):  XR CHEST STANDARD TWO VW 02/14/2014    Narrative  CLINICAL DATA:  Pain post fall 2 days ago    EXAM:  CHEST  2 VIEW    COMPARISON:  None.    FINDINGS:  Cardiomediastinal silhouette is unremarkable. There is streaky left  base retrocardiac atelectasis or infiltrate. Best seen on lateral  view. Probable nodular nipple shadow left base. Repeat frontal view  of the chest with nipple markers recommended for confirmation.    IMPRESSION:  There is streaky left base retrocardiac atelectasis or infiltrate.  Best seen on lateral view. Probable nodular nipple shadow left base.  Repeat frontal view of the chest with nipple markers recommended for  confirmation.      Electronically Signed  By: Cordella Deter M.D.  On: 02/14/2014 13:50       Allergies   Allergen Reactions    Latex      Other Reaction(s): Adverse reaction to substance; Severity: Mild to Moderate    Adhesive Tape Dermatitis     Clear surgical tape over port.    Chlorhexidine Dermatitis    Levofloxacin Swelling  Wound Dressings      Other Reaction(s): red skin       Current Outpatient Medications   Medication Sig Dispense Refill    Insulin Degludec FlexTouch 200 UNIT/ML SOPN       famotidine (PEPCID) 20 MG tablet Take 1 tablet by mouth nightly as needed      donepezil  (ARICEPT ) 10 MG tablet Take 1 tablet by mouth once daily 90 tablet 0    levothyroxine (SYNTHROID) 88 MCG tablet 1 tablet Daily      Multiple Vitamins-Minerals (PRESERVISION AREDS 2 PO) in the morning and at bedtime      Cholecalciferol (VITAMIN D3) 50 MCG (2000 UT) CAPS       B Complex-C-Folic Acid (SUPER B COMPLEX/FA/VIT C PO)       ONETOUCH ULTRA strip USE 1 STRIP TO CHECK GLUCOSE ONCE DAILY      ascorbic acid (VITAMIN C) 1000 MG tablet Take by mouth      aspirin 81 MG EC tablet Take 1 tablet by mouth daily      glimepiride (AMARYL) 4 MG tablet Take by mouth 2 times daily      lisinopril (PRINIVIL;ZESTRIL) 5 MG tablet Take 0.5 tablets by mouth daily      metFORMIN  (GLUCOPHAGE-XR) 500 MG extended release tablet Take 1 tablet by mouth 2 times daily       Current Facility-Administered Medications   Medication Dose Route Frequency Provider Last Rate Last Admin    methylPREDNISolone  acetate (DEPO-MEDROL ) injection 40 mg  40 mg Intra-artICUlar Once            Past Medical History:   Diagnosis Date    Acute cognitive decline     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    Diabetes (HCC)     History of prostate cancer 10/19/2011    Diagnosis 9/11    Hypothyroid 10/23/2013    Lewy body dementia (HCC) 09/05/2023    Microalbuminuria     Osteoarthritis 02/03/2009    Prostate CA (HCC) 10/19/2011    Unspecified essential hypertension 06/23/2008        Past Surgical History:   Procedure Laterality Date    ABDOMEN SURGERY  1980's    Inguinal Hernia repair    BACK SURGERY  90's    Dr Romilda Coaster. Lumbar    COLONOSCOPY      HERNIA REPAIR      bilateral    MOHS SURGERY  05/23/2016    SCC in situ L lateral cheek by Dr. Tempie Fee     ORTHOPEDIC SURGERY  06/2007    lumbar laminectomy    ORTHOPEDIC SURGERY      right shoulder surgery    SHOULDER SURGERY  1980       Family History   Problem Relation Age of Onset    Cancer Mother         breast    Diabetes Brother     Rheum Arthritis Father     Rheum Arthritis Sister         Social History     Tobacco Use    Smoking status: Former     Current packs/day: 0.00     Average packs/day: 0.5 packs/day for 15.0 years (7.5 ttl pk-yrs)     Types: Cigarettes     Quit date: 09/04/2002     Years since quitting: 21.3    Smokeless tobacco: Never   Substance Use Topics    Alcohol use: Not Currently  Alcohol/week: 0.8 standard drinks of alcohol        All systems reviewed x 12 and were negative with the exception of None      Failed to redirect to the Timeline version of the REVFS SmartLink.       Vitals:  Vitals:    12/24/23 1614   Weight: 104.3 kg (230 lb)   Height: 1.905 m (6\' 3" )      Body mass index is 28.75 kg/m.    Physical Exam    General: NAD, well developed,  well nourished, alert and oriented x 3.    Cardiac: Extremities well perfused    Respiratory: Nonlabored breathing    LLE: Normal gait and station.  Negative stinchfield. No effusion noted. No previous incisions noted. ROM 0-120 degrees. Grossly stable to varus/valgus stress and anterior/posterior drawer tests. Negative McMurrays.  Motor grossly intact.    RLE: Mild antalgic gait.  Mild effusion noted. No previous incisions noted. ROM 0-120 degrees. Grossly stable to varus/valgus stress and anterior/posterior drawer tests.  Medial joint line tenderness motor grossly intact.    Vascular: Palpable pedal pulses, equal bilaterally.    Skin: Warm well perfused, cap refill < 2 sec.        An electronic signature was used to authenticate this note.  -- Daryl Enter, MD

## 2024-02-05 ENCOUNTER — Encounter

## 2024-03-10 ENCOUNTER — Encounter

## 2024-03-12 MED ORDER — DONEPEZIL HCL 10 MG PO TABS
10 | ORAL_TABLET | Freq: Every day | ORAL | 3 refills | Status: AC
Start: 2024-03-12 — End: ?

## 2024-03-28 NOTE — Progress Notes (Unsigned)
 03/28/2024 Faxed copy of Certification of Health Care Provider to [phone].  Received message fax received OK.  Called Mr. Vincelette to get daughter's number as one on system did not work.  His wife answered the phone (he was asleep) and said she would relay the message to his daughter.  Send to scan.

## 2024-05-20 NOTE — Telephone Encounter (Signed)
 Re-certification plan of care requested. This has been scanned into the chart.      [facility] is requesting this as soon as possible.    Fax # [phone]    Thanks!

## 2024-05-21 NOTE — Telephone Encounter (Signed)
 [facility]) is asking for a VV for patient.  If not she will need to cancel.  It's really hard getting the patient out of the house.    Thanks!

## 2024-05-22 NOTE — Telephone Encounter (Signed)
 They are refaxing orders and we will fax back once signed

## 2024-05-27 NOTE — Telephone Encounter (Signed)
 VV scheduled with Ginny on 08/20/2024 at 11:00 AM.

## 2024-05-28 ENCOUNTER — Ambulatory Visit: Payer: MEDICARE | Attending: Neurology

## 2024-08-20 ENCOUNTER — Telehealth: Admit: 2024-08-19 | Discharge: 2024-08-20 | Payer: MEDICARE

## 2024-08-20 ENCOUNTER — Ambulatory Visit: Payer: MEDICARE

## 2024-08-20 DIAGNOSIS — F02B Dementia in other diseases classified elsewhere, moderate, without behavioral disturbance, psychotic disturbance, mood disturbance, and anxiety (HCC): Principal | ICD-10-CM

## 2024-08-20 MED ORDER — QUETIAPINE FUMARATE 25 MG PO TABS
25 | ORAL_TABLET | Freq: Two times a day (BID) | ORAL | 3 refills | 30.00000 days | Status: AC
Start: 2024-08-20 — End: ?

## 2024-08-20 NOTE — Progress Notes (Signed)
 "08/20/2024    TELEHEALTH EVALUATION -- Audio/Visual    HPI:    Stanley Holt (DOB:  03-Aug-1941) has requested an audio/video evaluation for the following concern(s):    Patient is being seen today with his wife for Lewy body with Parkinson's features dementia follow-up.  Patient was last seen September 13, 2023 by Dr. Alverta at which time he was established on donepezil  10 mg nightly and had experienced a decline per the patient's wife.  He was having difficulty with mobility with instances of freezing, and inability to turn, hand tremors and jerking movements during sleep, and struggling with saliva management.  He had had 1 fall resulting in a knee injury.  Patient's son was helping the patient's wife with the care.  Dr. Alverta ordered home health and OT therapy evaluations.    Today the patient's wife tells me that he has been getting physical therapy through [facility] and now he is having it 1 day a week.  She tells me that he does go to the [employer] [facility] 2 days of the week as well so that he can socialize with others.  There has been decline since his last visit.  Patient's wife tells me that his freezing with ambulation is more pronounced and he has had 3 falls so far this December.  1 of these falls was unwitnessed and he hit his head and was taken to an [facility] hospital where the wife tells me he had a CT scan of the head and it did not show anything acute.  He does have a bad knee that is bone-on-bone which he receives injections in by orthopedics.     Patient's wife tells me that the patient's primary care has retired and he now has a PA come to the home from [employer] for checkups and blood work etc. she tells me that blood work was just completed this month but it is not in our system.  He does have hallucinations auditory & visual seeing and speaking to people and animals and paranoia as if someone might be breaking into his garage or home.    He becomes increasingly confused and hallucinations are more  rampant around 3 PM and after.  She finds that conversations with the patient are very hard around this time of the day.  He is eating well and mood is generally good, but unfortunately he and his wife go to bed by 6 PM and she tells me he is getting up every 2 or 3 hours to urinate even though he is wearing briefs. This wakes her as well.  She tells me that they are always exhausted the next day.    Today the patient could tell me his date of birth but could not answer any of the other questions that I asked including the month that we are in, the holiday we have been December where [place] comes, the year, or his age.    Review of Systems   Musculoskeletal:  Positive for gait problem.   Neurological:         Memory loss   Psychiatric/Behavioral:  Positive for confusion, hallucinations and sleep disturbance.        Prior to Visit Medications   Medication Sig Taking? Authorizing Provider   QUEtiapine  (SEROQUEL ) 25 MG tablet Take 1 tablet by mouth 2 times daily Yes Lebron Amos, APRN - NP   donepezil  (ARICEPT ) 10 MG tablet Take 1 tablet by mouth once daily  Epps, Harlene CROME, MD  Insulin Degludec FlexTouch 200 UNIT/ML SOPN   [provider]   famotidine (PEPCID) 20 MG tablet Take 1 tablet by mouth nightly as needed  [provider]   levothyroxine (SYNTHROID) 88 MCG tablet 1 tablet Daily  [provider]   Multiple Vitamins-Minerals (PRESERVISION AREDS 2 PO) in the morning and at bedtime  [provider]   Cholecalciferol (VITAMIN D3) 50 MCG (2000 UT) CAPS   [provider]   B Complex-C-Folic Acid (SUPER B COMPLEX/FA/VIT C PO)   [provider]   ONETOUCH ULTRA strip USE 1 STRIP TO CHECK GLUCOSE ONCE DAILY  [provider]   ascorbic acid (VITAMIN C) 1000 MG tablet Take by mouth  Automatic Reconciliation, Ar   aspirin 81 MG EC tablet Take 1 tablet by mouth daily  Automatic Reconciliation, Ar   glimepiride (AMARYL) 4 MG tablet Take by mouth 2 times daily   Automatic Reconciliation, Ar   lisinopril (PRINIVIL;ZESTRIL) 5 MG tablet Take 0.5 tablets by mouth daily  Automatic Reconciliation, Ar   metFORMIN (GLUCOPHAGE-XR) 500 MG extended release tablet Take 1 tablet by mouth 2 times daily  Automatic Reconciliation, Ar       Social History     Tobacco Use    Smoking status: Former     Current packs/day: 0.00     Average packs/day: 0.5 packs/day for 15.0 years (7.5 ttl pk-yrs)     Types: Cigarettes     Quit date: 09/04/2002     Years since quitting: 21.9    Smokeless tobacco: Never   Substance Use Topics    Alcohol use: Not Currently     Alcohol/week: 0.8 standard drinks of alcohol    Drug use: No            PHYSICAL EXAMINATION:  [ INSTRUCTIONS:  [x]  Indicates a positive item  []  Indicates a negative item  -- DELETE ALL ITEMS NOT EXAMINED]  Vital Signs: (As obtained by patient/caregiver or practitioner observation)    Blood pressure-  Heart rate-    Respiratory rate-    Temperature-  Pulse oximetry-     Constitutional: [x]  Appears well-developed and well-nourished [x]  No apparent distress      []  Abnormal-   Mental status  [x]  Alert and awake  []  Oriented to person/place/time [] Able to follow commands      Eyes:  EOM    []   Normal  []  Abnormal-  Sclera  []   Normal  []  Abnormal -         Discharge []   None visible  []  Abnormal -    HENT:   []  Normocephalic, atraumatic.  []  Abnormal   []  Mouth/Throat: Mucous membranes are moist.     External Ears []  Normal  []  Abnormal-     Neck: []  No visualized mass     Pulmonary/Chest: [x]  Respiratory effort normal.  [x]  No visualized signs of difficulty breathing or respiratory distress        []  Abnormal-      Musculoskeletal:   []  Normal gait with no signs of ataxia         []  Normal range of motion of neck        []  Abnormal-       Neurological:        [x]  No Facial Asymmetry (Cranial nerve 7 motor function) (limited exam to video visit)          [x]  No gaze palsy        []   Abnormal-         Skin:        [x]  No significant  exanthematous lesions or discoloration noted on facial skin         []  Abnormal-            Psychiatric:       [x]  Normal Affect []  No Hallucinations        []  Abnormal-     Other pertinent observable physical exam findings-     ASSESSMENT/PLAN:  1. 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 Parkinsonism, unspecified Parkinsonism type (HCC)    Patient will continue to take Aricept  10 mg nightly for memory.    Will start the patient on some low-dose Seroquel  25 mg once or twice a day whenever the wife's feels he needs it.  Discussed potential side effects.    Patient's wife stated that it is too hard to get him into the office for in person follow-ups that she would like to continue with virtual visits at this time.    Will discuss with Dr. Alverta if Parkinson's medication may be appropriate for the patient or not and let the patient's wife know.    Bill on time note  I spent 53 minutes today in record review, image review, face-to-face interaction and documentation with the patient and his wife    Follow-up in 5 months    Return in about 5 months (around 01/18/2025).    Stanley Holt, was evaluated through a synchronous (real-time) audio-video encounter. The patient (or guardian if applicable) is aware that this is a billable service, which includes applicable co-pays. This Virtual Visit was conducted with patient's (and/or legal guardian's) consent. Patient identification was verified, and a caregiver was present when appropriate.   The patient was located at Home: [address]  [place] [address]  Provider was located at Home (Appt Dept State): [place]  Confirm you are appropriately licensed, registered, or certified to deliver care in the state where the patient is located as indicated above. If you are not or unsure, please re-schedule the visit: Yes, I confirm.       Total time spent on this encounter: 53 minutes    --Cheron Penner,  APRN - NP on 08/20/2024 at 11:57 AM    An electronic signature was used to authenticate this note.    "

## 2024-08-26 NOTE — Telephone Encounter (Signed)
"  Spoke with Dr. Alverta about this patient and wondered why he was not receiving meds for parkinsonian features.  Dr. Alverta stated that there is no reason why we could not try some he just was not sure that it would be beneficial.  Attempted to call the patient's wife but received a voicemail.  Will discuss this with the patient's wife at the next follow-up to see if it is something she would like to try.  "

## 2024-09-12 ENCOUNTER — Encounter
